# Patient Record
Sex: Female | Born: 1965
Health system: Southern US, Community
[De-identification: ages and names within clinical notes are randomized; demographics above are authoritative.]

## PROBLEM LIST (undated history)

## (undated) DIAGNOSIS — R011 Cardiac murmur, unspecified: Secondary | ICD-10-CM

## (undated) DIAGNOSIS — D6851 Activated protein C resistance: Secondary | ICD-10-CM

## (undated) DIAGNOSIS — M1990 Unspecified osteoarthritis, unspecified site: Secondary | ICD-10-CM

## (undated) HISTORY — DX: Cardiac murmur, unspecified: R01.1

## (undated) HISTORY — DX: Activated protein C resistance: D68.51

## (undated) HISTORY — DX: Unspecified osteoarthritis, unspecified site: M19.90

---

## 2003-03-20 ENCOUNTER — Other Ambulatory Visit: Admission: RE | Admit: 2003-03-20 | Discharge: 2003-03-20 | Payer: Self-pay | Admitting: Gynecology

## 2004-03-20 ENCOUNTER — Other Ambulatory Visit: Admission: RE | Admit: 2004-03-20 | Discharge: 2004-03-20 | Payer: Self-pay | Admitting: Gynecology

## 2004-12-15 HISTORY — PX: OTHER SURGICAL HISTORY: SHX169

## 2004-12-19 ENCOUNTER — Encounter: Admission: RE | Admit: 2004-12-19 | Discharge: 2004-12-19 | Payer: Self-pay | Admitting: Otolaryngology

## 2004-12-31 ENCOUNTER — Encounter (INDEPENDENT_AMBULATORY_CARE_PROVIDER_SITE_OTHER): Payer: Self-pay | Admitting: *Deleted

## 2004-12-31 ENCOUNTER — Ambulatory Visit (HOSPITAL_COMMUNITY): Admission: RE | Admit: 2004-12-31 | Discharge: 2004-12-31 | Payer: Self-pay | Admitting: Otolaryngology

## 2004-12-31 ENCOUNTER — Ambulatory Visit (HOSPITAL_BASED_OUTPATIENT_CLINIC_OR_DEPARTMENT_OTHER): Admission: RE | Admit: 2004-12-31 | Discharge: 2004-12-31 | Payer: Self-pay | Admitting: Otolaryngology

## 2005-03-24 ENCOUNTER — Other Ambulatory Visit: Admission: RE | Admit: 2005-03-24 | Discharge: 2005-03-24 | Payer: Self-pay | Admitting: Gynecology

## 2005-04-14 HISTORY — PX: HYSTEROSCOPY: SHX211

## 2005-05-01 ENCOUNTER — Ambulatory Visit (HOSPITAL_COMMUNITY): Admission: RE | Admit: 2005-05-01 | Discharge: 2005-05-01 | Payer: Self-pay | Admitting: Gynecology

## 2005-05-01 ENCOUNTER — Ambulatory Visit (HOSPITAL_BASED_OUTPATIENT_CLINIC_OR_DEPARTMENT_OTHER): Admission: RE | Admit: 2005-05-01 | Discharge: 2005-05-01 | Payer: Self-pay | Admitting: Gynecology

## 2005-05-01 ENCOUNTER — Encounter (INDEPENDENT_AMBULATORY_CARE_PROVIDER_SITE_OTHER): Payer: Self-pay | Admitting: Specialist

## 2005-09-26 ENCOUNTER — Ambulatory Visit: Payer: Self-pay | Admitting: Family Medicine

## 2006-03-30 ENCOUNTER — Other Ambulatory Visit: Admission: RE | Admit: 2006-03-30 | Discharge: 2006-03-30 | Payer: Self-pay | Admitting: Gynecology

## 2007-04-05 ENCOUNTER — Other Ambulatory Visit: Admission: RE | Admit: 2007-04-05 | Discharge: 2007-04-05 | Payer: Self-pay | Admitting: Gynecology

## 2008-04-06 ENCOUNTER — Other Ambulatory Visit: Admission: RE | Admit: 2008-04-06 | Discharge: 2008-04-06 | Payer: Self-pay | Admitting: Gynecology

## 2008-10-13 ENCOUNTER — Ambulatory Visit: Payer: Self-pay | Admitting: Gynecology

## 2009-04-09 ENCOUNTER — Ambulatory Visit: Payer: Self-pay | Admitting: Gynecology

## 2009-04-09 ENCOUNTER — Other Ambulatory Visit: Admission: RE | Admit: 2009-04-09 | Discharge: 2009-04-09 | Payer: Self-pay | Admitting: Gynecology

## 2009-04-09 ENCOUNTER — Encounter: Payer: Self-pay | Admitting: Gynecology

## 2009-04-17 ENCOUNTER — Ambulatory Visit: Payer: Self-pay | Admitting: Gynecology

## 2009-04-17 ENCOUNTER — Encounter: Admission: RE | Admit: 2009-04-17 | Discharge: 2009-04-17 | Payer: Self-pay | Admitting: Gynecology

## 2009-04-27 ENCOUNTER — Ambulatory Visit: Payer: Self-pay | Admitting: Gynecology

## 2009-06-01 ENCOUNTER — Ambulatory Visit: Payer: Self-pay | Admitting: Gynecology

## 2009-06-15 ENCOUNTER — Ambulatory Visit: Payer: Self-pay | Admitting: Gynecology

## 2009-07-05 ENCOUNTER — Encounter (INDEPENDENT_AMBULATORY_CARE_PROVIDER_SITE_OTHER): Payer: Self-pay | Admitting: *Deleted

## 2009-07-27 ENCOUNTER — Ambulatory Visit: Payer: Self-pay | Admitting: Gynecology

## 2009-08-15 HISTORY — PX: IUD REMOVAL: SHX5392

## 2009-08-17 ENCOUNTER — Ambulatory Visit: Payer: Self-pay | Admitting: Gynecology

## 2010-04-15 ENCOUNTER — Other Ambulatory Visit: Admission: RE | Admit: 2010-04-15 | Discharge: 2010-04-15 | Payer: Self-pay | Admitting: Gynecology

## 2010-04-15 ENCOUNTER — Ambulatory Visit: Payer: Self-pay | Admitting: Gynecology

## 2010-06-27 ENCOUNTER — Ambulatory Visit: Payer: Self-pay | Admitting: Gynecology

## 2011-05-02 NOTE — Op Note (Signed)
[name], [name]                 ACCOUNT NO.:  [id number]   MEDICAL RECORD NO.:  [id number]          PATIENT TYPE:  AMB   LOCATION:  [facility]                          FACILITY:  [facility]   PHYSICIAN:  Christopher E. Ezzard Standing, M.D.DATE OF BIRTH:  [birth year]   DATE OF PROCEDURE:  12/31/2004  DATE OF DISCHARGE:                                 OPERATIVE REPORT   PREOPERATIVE DIAGNOSES:  1.  Chronic right sphenoid sinus disease with changes consistent with fungal      sinusitis.  2.  Right ethmoid sinus disease.  3.  Chronic cryptic tonsillitis.   POSTOPERATIVE DIAGNOSES:  1.  Chronic right sphenoid sinus disease with changes consistent with fungal      sinusitis.  2.  Right ethmoid sinus disease.  3.  Chronic cryptic tonsillitis.   OPERATION:  1.  Frontal endoscopic sinus surgery with a right ethmoidectomy and right      sphenoidotomy with removal of fungal disease.  2.  Tonsillectomy.   SURGEON:  Kristine Garbe. Ezzard Standing, M.D.   ANESTHESIA:  General endotracheal.   COMPLICATIONS:  None.   BRIEF CLINICAL NOTE:  Colleen Gardner is a 45 year old female who has had  essentially a chronic sinus disease for several years.  She has had a  previous evaluation in 2002 which showed chronic, predominantly sphenoid  right-sided sinus disease.  She has had persistent drainage posteriorly from  the right sphenoid sinus for a number of years and on repeat CT scan  recently, she had what appeared to be a calcification and opacification of  the right sphenoid sinus consistent with a chronic right fungal sphenoid  sinusitis.  She has also had intermittent sore throats with moderate-sized  cryptic tonsils with white debris building up in the tonsillar crypts.  She  is taken to the operating room at this time for endoscopic sinus surgery  with a right ethmoidectomy, sphenoidotomy with removal of fungal sinus  disease and tonsillectomy.   DESCRIPTION OF PROCEDURE:  After adequate endotracheal anesthesia,  the  patient received 10 mg of Decadron IV preoperatively as well as 1 g of Ancef  IV preoperatively.  Nose was prepped with cotton pledgets soaked in Afrin  decongestant.  The turbinate and ethmoid area was then injected with  Xylocaine with epinephrine for hemostasis.  The lower portion of the middle  turbinate on the right side was amputated, which led back to the posterior  ethmoid area.  The posterior ethmoid area was opened up, showed only minimal  mucosal thickening, but no fungal elements in the ethmoid area.  The  patient's sphenoid sinus was again opened and enlarged with Kerrison forceps  and throughcut forceps.  On entering the sphenoid sinus, there was a ball of  hardened fungal-type tissue.  This was broken down and removed.  Specimen  was sent to pathology.  After removing all the visible fungal disease from  the sphenoid sinus, the sinus was irrigated with saline.  Hemostasis around  the sphenoidotomy was obtained with suction cautery.  This completed the  right-side ethmoidectomy and sphenoidotomy with removal of fungal disease.  The  patient was then turned and a mouth gag was used to expose the  oropharynx.  Tonsils were resected from the tonsillar fossae using cautery.  Care was taken to preserve the anterior and posterior tonsillar pillars as  well as the uvula.  Hemostasis was obtained with the cautery.  Oropharynx  was irrigated with saline.  This completed the procedure.  Colleen Gardner was awoken  from anesthesia and transferred to recovery postop doing well.   DISPOSITION:  Colleen Gardner is discharged home later this morning on Keflex  suspension 500 mg b.i.d. for 1 week and Tylenol and Lortab p.r.n. pain  I  will have her follow up in my office in 10 days for recheck.       CEN/MEDQ  D:  12/31/2004  T:  12/31/2004  Job:  [id number]

## 2011-05-02 NOTE — Op Note (Signed)
NAME:  Colleen Gardner, Colleen Gardner                 ACCOUNT NO.:  [id number]   MEDICAL RECORD NO.:  [id number]          PATIENT TYPE:  AMB   LOCATION:  [facility]                         FACILITY:  [facility]   PHYSICIAN:  Timothy P. Fontaine, M.D.DATE OF BIRTH:  10/10/1966   DATE OF PROCEDURE:  05/01/2005  DATE OF DISCHARGE:                                 OPERATIVE REPORT   .   PREOPERATIVE DIAGNOSES:  Submucous myomata.   POSTOPERATIVE DIAGNOSES:  Submucous myomata.   PROCEDURE:  Hysteroscopic submucous myomectomy.   SURGEON:  Timothy P. Fontaine, M.D.   ANESTHETIC:  General.   ESTIMATED BLOOD LOSS:  Minimal.   COMPLICATIONS:  None.   SORBITOL DISCREPANCY:  70 cc.   SPECIMEN:  1.  EMC.  2.  Myoma.   FINDINGS:  Mild arcuate cavity, submucous myoma, anterior lower uterine  segment, right and left tubal ostia visualized, remainder of cavity normal,  lower uterine segment and the cervical canal all normal.   DESCRIPTION OF PROCEDURE:  The patient was taken to the operating room,  underwent general anesthesia, was placed in the low dorsal lithotomy  position, received a vaginal and perineal preparation with Betadine  solution. Bladder emptied with in-and-out Foley catheterization. EUA  performed. The patient was draped in usual fashion. The cervix was  visualized with a weighted speculum, anterior lip grasped with single-tooth  tenaculum, and the cervix was gently gradually dilated to admit the  operative hysteroscope. Hysteroscopy was performed with findings noted  above. Using the right-angle resectoscopic loop, the submucous myoma was  resected in its entirety. The fragments were removed. A sharp curettage  performed.  Re-hysteroscopy showed an empty normal-appearing mild  arcuate cavity, good distention, no evidence of perforation. The instruments  were removed, adequate hemostasis visualized, the patient placed in the  supine position, awakened without difficulty, and taken to the recovery  room  in good condition, having tolerated procedure well,      TPF/MEDQ  D:  05/01/2005  T:  05/01/2005  Job:  [id number]

## 2011-05-02 NOTE — H&P (Signed)
NAME:  Colleen Gardner, Colleen Gardner                 ACCOUNT NO.:  [id number]   MEDICAL RECORD NO.:  [id number]          PATIENT TYPE:  AMB   LOCATION:  [facility]                         FACILITY:  [facility]   PHYSICIAN:  Timothy P. Fontaine, M.D.DATE OF BIRTH:  [birth year]   DATE OF ADMISSION:  05/01/2005  DATE OF DISCHARGE:                                HISTORY & PHYSICAL   ADMISSION DATE:  May 01, 2005 at 8:30.   CHIEF COMPLAINT:  Pelvic discomfort.   HISTORY OF PRESENT ILLNESS:  A 45 year old G56, P2 female on Ortho-Novum  7/7/7 oral contraceptives, presents complaining of some pelvic discomfort.  The patient underwent ultrasound, which was overall normal with right and  left ovaries visualized. No cul-de-sac fluid, although on endometrial  evaluation, she has what appears to be a well circumscribed submucosal myoma  in the upper mid cavity. The patient is admitted at this time for a  hysteroscopic evaluation and possible resection.   PAST MEDICAL HISTORY:  Uncomplicated.   PAST SURGICAL HISTORY:  Tonsillectomy and sinus surgery.   ALLERGIES:  None.   MEDICATIONS:  Ortho-Novum.   REVIEW OF SYSTEMS:  Noncontributory.   FAMILY HISTORY:  Noncontributory.   SOCIAL HISTORY:  Noncontributory.   PHYSICAL EXAMINATION:  VITAL SIGNS:  Afebrile. Vital signs are stable.  HEENT:  Normal.  LUNGS:  Clear.  CARDIAC:  Regular rate without murmur, rub, or gallop.  ABDOMEN:  Examination benign.  PELVIC:  External BUN vagina normal. Cervix normal. Uterus normal size.  Midline mobile. Adnexa without masses or tenderness.   ASSESSMENT:  A 45 year old G2, P2 female with some pelvic discomfort with  ultrasound suggestive of an endometrial defect, possible myoma versus polyp.   PLAN:  The patient is admitted at this time for hysteroscopy and resection  of defect. I reviewed with the patient the findings and the options for  sonohysterogram for better definition versus proceeding directly with  hysteroscopy was  reviewed. The area is well defined and certainly is very  suggestive of a polyp or myoma and we are going to go ahead and proceed with  hysteroscopy. Of note, she does have some pelvic discomfort for which she  has seen a urologist and they do not feel cystoscopy or other surgical  intervention is necessary and we are going to proceed with a hysteroscopy at  this time. I reviewed with her what is involved with hysteroscopy, resection  to include the expected intraoperative postoperative courses. I reviewed the  use of the resectoscope, use of the hysteroscopy, and the procedure. The  risks of bleeding, transfusion, infection, damage to internal organs with  uterine perforation including bowel, bladder, ureters, vessels, and nerves,  necessitating major exploratory reparative surgeries and future reparative  surgeries including ostomy formation was all discussed, understood and  accepted. The patient's questions are answered to her satisfaction and she  is ready to proceed with surgery.      TPF/MEDQ  D:  04/28/2005  T:  04/28/2005  Job:  [id number]

## 2011-06-23 ENCOUNTER — Encounter: Payer: Self-pay | Admitting: Gynecology

## 2011-06-30 ENCOUNTER — Encounter (INDEPENDENT_AMBULATORY_CARE_PROVIDER_SITE_OTHER): Payer: 59 | Admitting: Gynecology

## 2011-06-30 ENCOUNTER — Other Ambulatory Visit: Payer: Self-pay | Admitting: Gynecology

## 2011-06-30 ENCOUNTER — Other Ambulatory Visit (HOSPITAL_COMMUNITY)
Admission: RE | Admit: 2011-06-30 | Discharge: 2011-06-30 | Disposition: A | Discharge status: Home / Self Care | Payer: 59 | Source: Ambulatory Visit | Attending: Gynecology | Admitting: Gynecology

## 2011-06-30 DIAGNOSIS — Z833 Family history of diabetes mellitus: Secondary | ICD-10-CM

## 2011-06-30 DIAGNOSIS — Z124 Encounter for screening for malignant neoplasm of cervix: Secondary | ICD-10-CM | POA: Insufficient documentation

## 2011-06-30 DIAGNOSIS — Z13220 Encounter for screening for lipoid disorders: Secondary | ICD-10-CM

## 2011-06-30 DIAGNOSIS — Z01419 Encounter for gynecological examination (general) (routine) without abnormal findings: Secondary | ICD-10-CM

## 2011-06-30 DIAGNOSIS — B373 Candidiasis of vulva and vagina: Secondary | ICD-10-CM

## 2011-10-31 ENCOUNTER — Other Ambulatory Visit: Payer: Self-pay | Admitting: Gynecology

## 2012-04-28 ENCOUNTER — Telehealth: Payer: Self-pay | Admitting: *Deleted

## 2012-04-28 MED ORDER — FLUCONAZOLE 150 MG PO TABS: 150.0000 mg | ORAL_TABLET | Freq: Once | ORAL | Status: AC

## 2012-04-28 NOTE — Telephone Encounter (Signed)
Pt calling c/o yeast infection x 3 days now, itching, white discharge, burning. OV offered pt said she cant come until Friday. Pt would like rx. Last OV 06/2011 Please advise

## 2012-04-28 NOTE — Telephone Encounter (Signed)
Diflucan 150 mg x1 dose office visit if symptoms persist

## 2012-04-28 NOTE — Telephone Encounter (Signed)
PT INFORMED WITH THE BELOW NOTE. 

## 2012-04-30 ENCOUNTER — Encounter: Payer: Self-pay | Admitting: Gynecology

## 2012-04-30 ENCOUNTER — Ambulatory Visit (INDEPENDENT_AMBULATORY_CARE_PROVIDER_SITE_OTHER): Payer: 59 | Admitting: Gynecology

## 2012-04-30 DIAGNOSIS — D6851 Activated protein C resistance: Secondary | ICD-10-CM | POA: Insufficient documentation

## 2012-04-30 DIAGNOSIS — L293 Anogenital pruritus, unspecified: Secondary | ICD-10-CM

## 2012-04-30 DIAGNOSIS — I341 Nonrheumatic mitral (valve) prolapse: Secondary | ICD-10-CM | POA: Insufficient documentation

## 2012-04-30 DIAGNOSIS — N760 Acute vaginitis: Secondary | ICD-10-CM

## 2012-04-30 DIAGNOSIS — A499 Bacterial infection, unspecified: Secondary | ICD-10-CM

## 2012-04-30 DIAGNOSIS — N898 Other specified noninflammatory disorders of vagina: Secondary | ICD-10-CM

## 2012-04-30 LAB — WET PREP FOR TRICH, YEAST, CLUE
Clue Cells Wet Prep HPF POC: NONE SEEN
Trich, Wet Prep: NONE SEEN
Yeast Wet Prep HPF POC: NONE SEEN

## 2012-04-30 MED ORDER — METRONIDAZOLE 500 MG PO TABS: 500.0000 mg | ORAL_TABLET | Freq: Two times a day (BID) | ORAL | Status: AC

## 2012-04-30 NOTE — Progress Notes (Signed)
Patient presents with approximately one-week history of vaginal discharge and itching. She recalled we prescribe Diflucan 150x1 dose. This did not seem to help. She used OTC Monistat and her symptoms persisted.  Exam with Selena Batten chaperone present. External BUS vagina with white discharge. Cervix normal. Uterus normal size midline mobile nontender. Adnexa without masses or tenderness.  Assessment and plan: Wet prep consistent with BV. We'll treat with Flagyl 500 twice a day x7 days, alcohol avoidance reviewed. Follow up if symptoms persist or recur. Patient is due for her annual in July and I reminded her to schedule this.

## 2012-04-30 NOTE — Patient Instructions (Signed)
Take Flagyl 500 mg twice daily for 7 days. Avoid alcohol. Follow up if symptoms persist or recur. Make appointment for your annual exam in July.

## 2012-05-11 ENCOUNTER — Telehealth: Payer: Self-pay | Admitting: *Deleted

## 2012-05-11 MED ORDER — CLINDAMYCIN PHOSPHATE 2 % VA CREA: 1.0000 | TOPICAL_CREAM | Freq: Every day | VAGINAL | Status: AC

## 2012-05-11 NOTE — Telephone Encounter (Signed)
Pt was giving Flagyl 500 twice a day x7 days for BV infection on 04/30/12, she took all medication and now c/o foul odor after sex. Pt is wondering if BV infection is still there? Pt said she feels fine, no other complaints only odor after sex. Please advise

## 2012-05-11 NOTE — Telephone Encounter (Signed)
I would recommend Cleocin vaginal cream nightly x1 week. Office visit if symptoms persist.

## 2012-05-12 NOTE — Telephone Encounter (Signed)
Left on voicemail rx sent to pharmacy.

## 2012-07-02 ENCOUNTER — Encounter: Payer: Self-pay | Admitting: Gynecology

## 2012-07-02 ENCOUNTER — Ambulatory Visit (INDEPENDENT_AMBULATORY_CARE_PROVIDER_SITE_OTHER): Payer: 59 | Admitting: Gynecology

## 2012-07-02 VITALS — BP 110/64 | Ht 65.0 in | Wt 114.0 lb

## 2012-07-02 DIAGNOSIS — N951 Menopausal and female climacteric states: Secondary | ICD-10-CM

## 2012-07-02 DIAGNOSIS — Z01419 Encounter for gynecological examination (general) (routine) without abnormal findings: Secondary | ICD-10-CM

## 2012-07-02 NOTE — Patient Instructions (Signed)
Office will call you with hormone results. Keep menstrual calendar as long as regular menses we will follow. If irregular menses and call. Follow up in one year for annual gynecologic exam.

## 2012-07-02 NOTE — Progress Notes (Signed)
Colleen Gardner [birth year] [id number]        46 y.o.  G2P2002 for annual exam.  Several issues noted below.  Past medical history,surgical history, medications, allergies, family history and social history were all reviewed and documented in the EPIC chart. ROS:  Was performed and pertinent positives and negatives are included in the history.  Exam: Kim assistant Filed Vitals:   07/02/12 1516  BP: 110/64  Height: 5\' 5"  (1.651 m)  Weight: 114 lb (51.71 kg)   General appearance  Normal Skin grossly normal Head/Neck normal with no cervical or supraclavicular adenopathy thyroid normal Lungs  clear Cardiac RR, without RMG Abdominal  soft, nontender, without masses, organomegaly or hernia Breasts  examined lying and sitting without masses, retractions, discharge or axillary adenopathy. Pelvic  Ext/BUS/vagina  normal   Cervix  normal   Uterus  anteverted, normal size, shape and contour, midline and mobile nontender   Adnexa  Without masses or tenderness    Anus and perineum  normal   Rectovaginal  normal sphincter tone without palpated masses or tenderness.    Assessment/Plan:  46 y.o. G14P2002 female for annual exam, vasectomy birth control.   1. Missed menses in February otherwise regular monthly menses. Some hot flushes night sweats coming and going but not consistently. We'll check baseline FSH TSH. I think this is an auditory irregularity and patient will keep menstrual calendar as long as menses remain normal then we'll follow. 2. Pap smear. Last Pap smear 2012. No Pap done today. No history of abnormal Pap smears with numerous normal reports in her chart. I discussed current screening guidelines we'll plan every 3-5 your Pap smears. 3. Mammography.  Patient had her mammogram this year. She'll continue with annual mammography. SBE monthly reviewed. 4. Health maintenance. Patient had normal CBC lipid profile and glucose last year and will not repeat now. Assuming regular menses and  otherwise doing well and she will see [place] in a year, sooner as needed.    Dara Lords MD, 4:00 PM 07/02/2012

## 2012-07-03 LAB — URINALYSIS W MICROSCOPIC + REFLEX CULTURE
Bacteria, UA: NONE SEEN
Bilirubin Urine: NEGATIVE
Ketones, ur: NEGATIVE mg/dL
Protein, ur: NEGATIVE mg/dL
Urobilinogen, UA: 1 mg/dL (ref 0.0–1.0)

## 2012-07-03 LAB — TSH: TSH: 2.223 u[IU]/mL (ref 0.350–4.500)

## 2012-08-09 ENCOUNTER — Telehealth: Payer: Self-pay | Admitting: *Deleted

## 2012-08-09 NOTE — Telephone Encounter (Signed)
Pt informed with the below note. She will follow up as needed. 

## 2012-08-09 NOTE — Telephone Encounter (Signed)
Watch for now. As long as it is resolved we will follow. If it becomes recurrent call and we'll schedule ultrasound.

## 2012-08-09 NOTE — Telephone Encounter (Signed)
Pt had left side lower pain on Friday afternoon, sharp pain radiating down to her groin area. Pt said pain lasted for about 4 hours off & on. Pt said this has happened before 2 weeks after her cycle. LMP:07/22/12. She is not in any pain now. Pt would like advice if ultrasound should be done? Please advise

## 2013-07-13 ENCOUNTER — Ambulatory Visit: Payer: 59 | Admitting: Sports Medicine

## 2013-09-02 ENCOUNTER — Encounter: Payer: Self-pay | Admitting: Gynecology

## 2013-09-02 ENCOUNTER — Ambulatory Visit (INDEPENDENT_AMBULATORY_CARE_PROVIDER_SITE_OTHER): Payer: 59 | Admitting: Gynecology

## 2013-09-02 VITALS — BP 110/70 | Ht 65.0 in | Wt 119.0 lb

## 2013-09-02 DIAGNOSIS — N814 Uterovaginal prolapse, unspecified: Secondary | ICD-10-CM

## 2013-09-02 DIAGNOSIS — N898 Other specified noninflammatory disorders of vagina: Secondary | ICD-10-CM

## 2013-09-02 DIAGNOSIS — N899 Noninflammatory disorder of vagina, unspecified: Secondary | ICD-10-CM

## 2013-09-02 DIAGNOSIS — Z01419 Encounter for gynecological examination (general) (routine) without abnormal findings: Secondary | ICD-10-CM

## 2013-09-02 DIAGNOSIS — Z13220 Encounter for screening for lipoid disorders: Secondary | ICD-10-CM

## 2013-09-02 DIAGNOSIS — N816 Rectocele: Secondary | ICD-10-CM

## 2013-09-02 LAB — COMPREHENSIVE METABOLIC PANEL
Alkaline Phosphatase: 41 U/L (ref 39–117)
CO2: 27 mEq/L (ref 19–32)
Creat: 0.72 mg/dL (ref 0.50–1.10)
Glucose, Bld: 89 mg/dL (ref 70–99)
Sodium: 136 mEq/L (ref 135–145)
Total Bilirubin: 0.5 mg/dL (ref 0.3–1.2)
Total Protein: 6.8 g/dL (ref 6.0–8.3)

## 2013-09-02 LAB — CBC WITH DIFFERENTIAL/PLATELET
Basophils Relative: 1 % (ref 0–1)
Eosinophils Absolute: 0.1 10*3/uL (ref 0.0–0.7)
Eosinophils Relative: 2 % (ref 0–5)
Hemoglobin: 14.5 g/dL (ref 12.0–15.0)
MCH: 33.9 pg (ref 26.0–34.0)
MCHC: 35.1 g/dL (ref 30.0–36.0)
MCV: 96.5 fL (ref 78.0–100.0)
Monocytes Relative: 6 % (ref 3–12)
Neutrophils Relative %: 63 % (ref 43–77)
Platelets: 200 10*3/uL (ref 150–400)

## 2013-09-02 LAB — LIPID PANEL
Cholesterol: 143 mg/dL (ref 0–200)
HDL: 61 mg/dL (ref >39)
LDL Cholesterol: 62 mg/dL (ref 0–99)
Total CHOL/HDL Ratio: 2.3 Ratio
Triglycerides: 99 mg/dL (ref <150)
VLDL: 20 mg/dL (ref 0–40)

## 2013-09-02 LAB — WET PREP FOR TRICH, YEAST, CLUE
Clue Cells Wet Prep HPF POC: NONE SEEN
Trich, Wet Prep: NONE SEEN

## 2013-09-02 MED ORDER — METRONIDAZOLE 500 MG PO TABS: 500.0000 mg | ORAL_TABLET | Freq: Two times a day (BID) | ORAL | Status: DC

## 2013-09-02 NOTE — Progress Notes (Signed)
Colleen Gardner 06-15-1966 [id number]        47 y.o.  G2P2002 for annual exam.  Several issues noted below.  Past medical history,surgical history, medications, allergies, family history and social history were all reviewed and documented in the EPIC chart.  ROS:  Performed and pertinent positives and negatives are included in the history, assessment and plan .  Exam: Kim assistant Filed Vitals:   09/02/13 1429  BP: 110/70  Height: 5\' 5"  (1.651 m)  Weight: 119 lb (53.978 kg)   General appearance  Normal Skin grossly normal Head/Neck normal with no cervical or supraclavicular adenopathy thyroid normal Lungs  clear Cardiac RR, without RMG Abdominal  soft, nontender, without masses, organomegaly or hernia Breasts  examined lying and sitting without masses, retractions, discharge or axillary adenopathy. Pelvic  Ext/BUS/vagina  with second-degree rectocele with straining. Cervix within 2 fingerbreadths of introital opening with straining.   Cervix  normal   Uterus  anteverted, normal size, shape and contour, midline and mobile nontender   Adnexa  Without masses or tenderness    Anus and perineum  normal   Rectovaginal  mild decreased sphincter tone without palpated masses or tenderness. Second-degree rectocele noted   Assessment/Plan:  47 y.o. [id number] female for annual exam, regular menses, vasectomy birth control.   1. Pelvic relaxation. Patient with symptoms of something coming out particularly with exercise. Exam consistent with uterine prolapse and rectocele. Bladder overall looks well supported. Is not having any issues with urinary or fecal incontinence. Options for management include observation, trial of pessary and surgery to include TVH posterior colporrhaphy discussed. Patient is not interested in pursuing at this point but prefers to observe. Will followup if it becomes more of an issue. 2. Complaints of vaginal irritation. Used OTC antifungal suppositories last week with some  improvement. Wet prep today suggestive of bacterial vaginosis. Will treat with Flagyl 500 mg twice a day x7 days. Alcohol avoidance reviewed. 3. Pap smear 2012. No Pap smear done today. No history of abnormal Pap smears previously. Plan repeat Pap smear next year 3 year interval. 4. Mammography this year. Will continue with annual mammography. SBE monthly reviewed. 5. Health maintenance. Baseline CBC comprehensive metabolic panel lipid profile urinalysis ordered. Followup in one year, sooner if pelvic relaxation becomes an issue.  Note: This document was prepared with digital dictation and possible smart phrase technology. Any transcriptional errors that result from this process are unintentional.   Dara Lords MD, 3:14 PM 09/02/2013

## 2013-09-02 NOTE — Patient Instructions (Signed)
Followup in one year for annual exam, sooner if any issues 

## 2013-09-03 LAB — URINALYSIS W MICROSCOPIC + REFLEX CULTURE
Bacteria, UA: NONE SEEN
Bilirubin Urine: NEGATIVE
Crystals: NONE SEEN
Hgb urine dipstick: NEGATIVE
Ketones, ur: NEGATIVE mg/dL
Nitrite: NEGATIVE
Protein, ur: NEGATIVE mg/dL
Urobilinogen, UA: 0.2 mg/dL (ref 0.0–1.0)

## 2013-09-05 ENCOUNTER — Telehealth: Payer: Self-pay | Admitting: *Deleted

## 2013-09-05 MED ORDER — CLINDAMYCIN HCL 300 MG PO CAPS: 300.0000 mg | ORAL_CAPSULE | Freq: Two times a day (BID) | ORAL | Status: DC

## 2013-09-05 NOTE — Telephone Encounter (Signed)
Pt was given flagyl on OV 09/02/13 she took 3 pills and had to stop because she started to experience some pain behind her eyes, headache. Pt asked if another Rx could be given? Her cycle will be starting soon just as FYI if would be prescribed. Please advise

## 2013-09-05 NOTE — Telephone Encounter (Signed)
Cleocin 300 mg twice a day x7 days

## 2013-09-05 NOTE — Telephone Encounter (Signed)
Left on voicemail,rx sent. 

## 2013-10-20 ENCOUNTER — Other Ambulatory Visit: Payer: Self-pay

## 2014-01-27 ENCOUNTER — Encounter: Payer: Self-pay | Admitting: Gynecology

## 2014-01-27 ENCOUNTER — Telehealth: Payer: Self-pay | Admitting: *Deleted

## 2014-01-27 ENCOUNTER — Ambulatory Visit (INDEPENDENT_AMBULATORY_CARE_PROVIDER_SITE_OTHER): Payer: 59 | Admitting: Gynecology

## 2014-01-27 DIAGNOSIS — N6019 Diffuse cystic mastopathy of unspecified breast: Secondary | ICD-10-CM

## 2014-01-27 DIAGNOSIS — B9689 Other specified bacterial agents as the cause of diseases classified elsewhere: Secondary | ICD-10-CM

## 2014-01-27 DIAGNOSIS — N760 Acute vaginitis: Secondary | ICD-10-CM

## 2014-01-27 DIAGNOSIS — A499 Bacterial infection, unspecified: Secondary | ICD-10-CM

## 2014-01-27 LAB — WET PREP FOR TRICH, YEAST, CLUE
Clue Cells Wet Prep HPF POC: NONE SEEN
TRICH WET PREP: NONE SEEN
WBC, Wet Prep HPF POC: NONE SEEN
Yeast Wet Prep HPF POC: NONE SEEN

## 2014-01-27 MED ORDER — CLINDAMYCIN PHOSPHATE 2 % VA CREA: 1.0000 | TOPICAL_CREAM | Freq: Every day | VAGINAL | Status: DC

## 2014-01-27 NOTE — Patient Instructions (Signed)
Use the Cleocin vaginal cream nightly for 7 days. Call if any issues with this. Office will contact you to arrange the breast ultrasound and mammogram. 6

## 2014-01-27 NOTE — Telephone Encounter (Signed)
Per note for 01/27/14 "New onset nodularity left breast. It feels to probably be fibrocystic changes. Will pursue ultrasound and diagnostic mammography for assurance" per pt request she would like done at [facility] order faxed appt on 02/01/14 @ 11:00 am left on voicemail this has been done

## 2014-01-27 NOTE — Progress Notes (Signed)
Colleen Gardner 12-10-1966 [id number]        48 y.o.  G3O7564 Patient presents with 2 separate issues:  1. New left breast mass noted over the last several weeks. Nontender. No nipple discharge or other issues. Mammogram reported this past summer. 2. One day history of vaginal odor. No real discharge or irritation. Past history of bacterial vaginosis.   Past medical history,surgical history, problem list, medications, allergies, family history and social history were all reviewed and documented in the EPIC chart.  Exam: Kim assistant General appearance  Normal  Breasts examined lying and sitting Right without masses,,, retractions discharge, adenopathy. Left with fibroglandular nodularity 12 to 1:00 position 2 fingerbreadths off the areola. Skin changes, nipple discharge or axillary adenopathy. Pelvic external BUS vagina with generalized erythema. No significant discharge. Cervix normal. Uterus normal size midline mobile nontender. Adnexa without masses or tenderness.  Assessment/Plan:  48 y.o. G2P2002   1. New onset nodularity left breast. It feels to probably be fibrocystic changes. Will pursue ultrasound and diagnostic mammography for assurance. Patient agrees with this and will followup for the studies as arranged. 2. History/exam/wet prep consistent with early bacterial vaginosis. She had reaction to both oral Flagyl and oral Cleocin both atypical with headaches diarrhea but no classic rash or hives. After lengthy discussion she wants a trial of Cleocin vaginal cream but acknowledges potential for allergic reaction to this also. Followup if any issues with the treatment or persistence/recurrence of her symptoms.    Note: This document was prepared with digital dictation and possible smart phrase technology. Any transcriptional errors that result from this process are unintentional.   Anastasio Auerbach MD, 10:45 AM 01/27/2014

## 2014-01-27 NOTE — Addendum Note (Signed)
Addended by: Nelva Nay on: 01/27/2014 11:16 AM   Modules accepted: Orders

## 2014-10-16 ENCOUNTER — Encounter: Payer: Self-pay | Admitting: Gynecology

## 2015-01-05 ENCOUNTER — Encounter: Payer: 59 | Admitting: Gynecology

## 2015-01-26 ENCOUNTER — Encounter: Payer: Self-pay | Admitting: Gynecology

## 2015-02-09 ENCOUNTER — Encounter: Payer: Self-pay | Admitting: Gynecology

## 2015-02-09 ENCOUNTER — Ambulatory Visit (INDEPENDENT_AMBULATORY_CARE_PROVIDER_SITE_OTHER): Payer: 59 | Admitting: Gynecology

## 2015-02-09 ENCOUNTER — Other Ambulatory Visit (HOSPITAL_COMMUNITY)
Admission: RE | Admit: 2015-02-09 | Discharge: 2015-02-09 | Disposition: A | Discharge status: Home / Self Care | Payer: 59 | Source: Ambulatory Visit | Attending: Gynecology | Admitting: Gynecology

## 2015-02-09 VITALS — BP 112/66 | Ht 66.0 in | Wt 118.0 lb

## 2015-02-09 DIAGNOSIS — Z01419 Encounter for gynecological examination (general) (routine) without abnormal findings: Secondary | ICD-10-CM | POA: Insufficient documentation

## 2015-02-09 DIAGNOSIS — N393 Stress incontinence (female) (male): Secondary | ICD-10-CM

## 2015-02-09 DIAGNOSIS — N814 Uterovaginal prolapse, unspecified: Secondary | ICD-10-CM

## 2015-02-09 DIAGNOSIS — N898 Other specified noninflammatory disorders of vagina: Secondary | ICD-10-CM

## 2015-02-09 DIAGNOSIS — Z1151 Encounter for screening for human papillomavirus (HPV): Secondary | ICD-10-CM | POA: Diagnosis present

## 2015-02-09 DIAGNOSIS — N816 Rectocele: Secondary | ICD-10-CM

## 2015-02-09 DIAGNOSIS — N951 Menopausal and female climacteric states: Secondary | ICD-10-CM

## 2015-02-09 LAB — CBC WITH DIFFERENTIAL/PLATELET
BASOS ABS: 0 10*3/uL (ref 0.0–0.1)
Basophils Relative: 1 % (ref 0–1)
EOS ABS: 0 10*3/uL (ref 0.0–0.7)
EOS PCT: 1 % (ref 0–5)
HCT: 43 % (ref 36.0–46.0)
HEMOGLOBIN: 14.4 g/dL (ref 12.0–15.0)
LYMPHS PCT: 31 % (ref 12–46)
Lymphs Abs: 1.5 10*3/uL (ref 0.7–4.0)
MCH: 32.7 pg (ref 26.0–34.0)
MCHC: 33.5 g/dL (ref 30.0–36.0)
MCV: 97.5 fL (ref 78.0–100.0)
MONOS PCT: 11 % (ref 3–12)
MPV: 11.2 fL (ref 8.6–12.4)
Monocytes Absolute: 0.5 10*3/uL (ref 0.1–1.0)
NEUTROS ABS: 2.7 10*3/uL (ref 1.7–7.7)
Neutrophils Relative %: 56 % (ref 43–77)
Platelets: 205 10*3/uL (ref 150–400)
RBC: 4.41 MIL/uL (ref 3.87–5.11)
RDW: 13.1 % (ref 11.5–15.5)
WBC: 4.8 10*3/uL (ref 4.0–10.5)

## 2015-02-09 LAB — WET PREP FOR TRICH, YEAST, CLUE
Clue Cells Wet Prep HPF POC: NONE SEEN
TRICH WET PREP: NONE SEEN
YEAST WET PREP: NONE SEEN

## 2015-02-09 LAB — TSH: TSH: 1.801 u[IU]/mL (ref 0.350–4.500)

## 2015-02-09 MED ORDER — FLUCONAZOLE 150 MG PO TABS: 150.0000 mg | ORAL_TABLET | Freq: Once | ORAL | Status: DC

## 2015-02-09 NOTE — Patient Instructions (Signed)
You may obtain a copy of any labs that were done today by logging onto MyChart as outlined in the instructions provided with your AVS (after visit summary). The office will not call with normal lab results but certainly if there are any significant abnormalities then we will contact you.   Health Maintenance, Female A healthy lifestyle and preventative care can promote health and wellness.  Maintain regular health, dental, and eye exams.  Eat a healthy diet. Foods like vegetables, fruits, whole grains, low-fat dairy products, and lean protein foods contain the nutrients you need without too many calories. Decrease your intake of foods high in solid fats, added sugars, and salt. Get information about a proper diet from your caregiver, if necessary.  Regular physical exercise is one of the most important things you can do for your health. Most adults should get at least 150 minutes of moderate-intensity exercise (any activity that increases your heart rate and causes you to sweat) each week. In addition, most adults need muscle-strengthening exercises on 2 or more days a week.   Maintain a healthy weight. The body mass index (BMI) is a screening tool to identify possible weight problems. It provides an estimate of body fat based on height and weight. Your caregiver can help determine your BMI, and can help you achieve or maintain a healthy weight. For adults 20 years and older:  A BMI below 18.5 is considered underweight.  A BMI of 18.5 to 24.9 is normal.  A BMI of 25 to 29.9 is considered overweight.  A BMI of 30 and above is considered obese.  Maintain normal blood lipids and cholesterol by exercising and minimizing your intake of saturated fat. Eat a balanced diet with plenty of fruits and vegetables. Blood tests for lipids and cholesterol should begin at age 61 and be repeated every 5 years. If your lipid or cholesterol levels are high, you are over 50, or you are a high risk for heart  disease, you may need your cholesterol levels checked more frequently.Ongoing high lipid and cholesterol levels should be treated with medicines if diet and exercise are not effective.  If you smoke, find out from your caregiver how to quit. If you do not use tobacco, do not start.  Lung cancer screening is recommended for adults aged 33 80 years who are at high risk for developing lung cancer because of a history of smoking. Yearly low-dose computed tomography (CT) is recommended for people who have at least a 30-pack-year history of smoking and are a current smoker or have quit within the past 15 years. A pack year of smoking is smoking an average of 1 pack of cigarettes a day for 1 year (for example: 1 pack a day for 30 years or 2 packs a day for 15 years). Yearly screening should continue until the smoker has stopped smoking for at least 15 years. Yearly screening should also be stopped for people who develop a health problem that would prevent them from having lung cancer treatment.  If you are pregnant, do not drink alcohol. If you are breastfeeding, be very cautious about drinking alcohol. If you are not pregnant and choose to drink alcohol, do not exceed 1 drink per day. One drink is considered to be 12 ounces (355 mL) of beer, 5 ounces (148 mL) of wine, or 1.5 ounces (44 mL) of liquor.  Avoid use of street drugs. Do not share needles with anyone. Ask for help if you need support or instructions about stopping  the use of drugs.  High blood pressure causes heart disease and increases the risk of stroke. Blood pressure should be checked at least every 1 to 2 years. Ongoing high blood pressure should be treated with medicines, if weight loss and exercise are not effective.  If you are 59 to 47 years old, ask your caregiver if you should take aspirin to prevent strokes.  Diabetes screening involves taking a blood sample to check your fasting blood sugar level. This should be done once every 3  years, after age 91, if you are within normal weight and without risk factors for diabetes. Testing should be considered at a younger age or be carried out more frequently if you are overweight and have at least 1 risk factor for diabetes.  Breast cancer screening is essential preventative care for women. You should practice "breast self-awareness." This means understanding the normal appearance and feel of your breasts and may include breast self-examination. Any changes detected, no matter how small, should be reported to a caregiver. Women in their 66s and 30s should have a clinical breast exam (CBE) by a caregiver as part of a regular health exam every 1 to 3 years. After age 101, women should have a CBE every year. Starting at age 100, women should consider having a mammogram (breast X-ray) every year. Women who have a family history of breast cancer should talk to their caregiver about genetic screening. Women at a high risk of breast cancer should talk to their caregiver about having an MRI and a mammogram every year.  Breast cancer gene (BRCA)-related cancer risk assessment is recommended for women who have family members with BRCA-related cancers. BRCA-related cancers include breast, ovarian, tubal, and peritoneal cancers. Having family members with these cancers may be associated with an increased risk for harmful changes (mutations) in the breast cancer genes BRCA1 and BRCA2. Results of the assessment will determine the need for genetic counseling and BRCA1 and BRCA2 testing.  The Pap test is a screening test for cervical cancer. Women should have a Pap test starting at age 57. Between ages 25 and 35, Pap tests should be repeated every 2 years. Beginning at age 37, you should have a Pap test every 3 years as long as the past 3 Pap tests have been normal. If you had a hysterectomy for a problem that was not cancer or a condition that could lead to cancer, then you no longer need Pap tests. If you are  between ages 50 and 76, and you have had normal Pap tests going back 10 years, you no longer need Pap tests. If you have had past treatment for cervical cancer or a condition that could lead to cancer, you need Pap tests and screening for cancer for at least 20 years after your treatment. If Pap tests have been discontinued, risk factors (such as a new sexual partner) need to be reassessed to determine if screening should be resumed. Some women have medical problems that increase the chance of getting cervical cancer. In these cases, your caregiver may recommend more frequent screening and Pap tests.  The human papillomavirus (HPV) test is an additional test that may be used for cervical cancer screening. The HPV test looks for the virus that can cause the cell changes on the cervix. The cells collected during the Pap test can be tested for HPV. The HPV test could be used to screen women aged 44 years and older, and should be used in women of any age  who have unclear Pap test results. After the age of 55, women should have HPV testing at the same frequency as a Pap test.  Colorectal cancer can be detected and often prevented. Most routine colorectal cancer screening begins at the age of 44 and continues through age 20. However, your caregiver may recommend screening at an earlier age if you have risk factors for colon cancer. On a yearly basis, your caregiver may provide home test kits to check for hidden blood in the stool. Use of a small camera at the end of a tube, to directly examine the colon (sigmoidoscopy or colonoscopy), can detect the earliest forms of colorectal cancer. Talk to your caregiver about this at age 86, when routine screening begins. Direct examination of the colon should be repeated every 5 to 10 years through age 13, unless early forms of pre-cancerous polyps or small growths are found.  Hepatitis C blood testing is recommended for all people born from [birth year] through 1963 and any  individual with known risks for hepatitis C.  Practice safe sex. Use condoms and avoid high-risk sexual practices to reduce the spread of sexually transmitted infections (STIs). Sexually active women aged 36 and younger should be checked for Chlamydia, which is a common sexually transmitted infection. Older women with new or multiple partners should also be tested for Chlamydia. Testing for other STIs is recommended if you are sexually active and at increased risk.  Osteoporosis is a disease in which the bones lose minerals and strength with aging. This can result in serious bone fractures. The risk of osteoporosis can be identified using a bone density scan. Women ages 20 and over and women at risk for fractures or osteoporosis should discuss screening with their caregivers. Ask your caregiver whether you should be taking a calcium supplement or vitamin D to reduce the rate of osteoporosis.  Menopause can be associated with physical symptoms and risks. Hormone replacement therapy is available to decrease symptoms and risks. You should talk to your caregiver about whether hormone replacement therapy is right for you.  Use sunscreen. Apply sunscreen liberally and repeatedly throughout the day. You should seek shade when your shadow is shorter than you. Protect yourself by wearing long sleeves, pants, a wide-brimmed hat, and sunglasses year round, whenever you are outdoors.  Notify your caregiver of new moles or changes in moles, especially if there is a change in shape or color. Also notify your caregiver if a mole is larger than the size of a pencil eraser.  Stay current with your immunizations. Document Released: 06/16/2011 Document Revised: 03/28/2013 Document Reviewed: 06/16/2011 [facility] Patient Information 2014 [employer].

## 2015-02-09 NOTE — Addendum Note (Signed)
Addended by: Nelva Nay on: 02/09/2015 04:57 PM   Modules accepted: Orders

## 2015-02-09 NOTE — Addendum Note (Signed)
Addended by: Thurnell Garbe A on: 02/09/2015 05:00 PM   Modules accepted: SmartSet

## 2015-02-09 NOTE — Progress Notes (Signed)
Colleen Gardner 23-Sep-1966 [id number]        49 y.o.  G2P2002 for annual exam.  Several issues noted below.  Past medical history,surgical history, problem list, medications, allergies, family history and social history were all reviewed and documented as reviewed in the EPIC chart.  ROS:  Performed with pertinent positives and negatives included in the history, assessment and plan.   Additional significant findings :  none   Exam: Kim Counsellor Vitals:   02/09/15 1612  BP: 112/66  Height: 5\' 6"  (1.676 m)  Weight: 118 lb (53.524 kg)   General appearance:  Normal affect, orientation and appearance. Skin: Grossly normal HEENT: Without gross lesions.  No cervical or supraclavicular adenopathy. Thyroid normal.  Lungs:  Clear without wheezing, rales or rhonchi Cardiac: RR, without RMG Abdominal:  Soft, nontender, without masses, guarding, rebound, organomegaly or hernia Breasts:  Examined lying and sitting without masses, retractions, discharge or axillary adenopathy. Pelvic:  Ext/BUS/vagina with slight white discharge. Minimal cystocele. First-degree uterine prolapse. Second degree rectocele  Cervix grossly normal  Uterus axial to anteverted, normal size, shape and contour, midline and mobile nontender   Adnexa  Without masses or tenderness    Anus and perineum  Normal   Rectovaginal  Normal sphincter tone without palpated masses or tenderness.    Assessment/Plan:  49 y.o. G46P2002 female for annual exam with monthly menses, vasectomy birth control.   1. Uterine prolapse/rectocele/mild cystocele. Patient does note something protruding from the vagina which on exam appears to be her rectocele. She does notice it particularly when she is up on her feet. Also having symptoms of mild issue I with loss of urine with coughing sneezing and laughing. Options for management including expectant management, pessary, surgery to include TVH, posterior colporrhaphy, possible anterior  colporrhaphy, possible sling. Options for referral to urology if considering sling reviewed. At this point patients not interested in pursuing surgery but wants to think about her options and will follow up if she chooses. Check urinalysis today. 2. Occasional hot flushes and night sweats. Seem to come in waves where she will have them for a week and then not have them for several weeks. Menses mildly irregular there'll come a little earlier or little later, little heavier little lighter.   Will check baseline FSH TSH. 3. Mammography fall 2015. Do not have a copy of this but she is adamant about having this done. We'll continue with annual mammography this coming fall. 4. Pap smear 2012. Pap/HPV today. No history of significant abnormal Pap smears. Plan repeat Pap smear at 3-5 year interval assuming this Pap smears normal. 5. Health maintenance. Baseline CBC comprehensive metabolic panel lipid profile urinalysis TSH FSH ordered. Follow up with decision as far surgery if she chooses otherwise 1 year for annual exam.     Anastasio Auerbach MD, 4:49 PM 02/09/2015

## 2015-02-10 LAB — COMPREHENSIVE METABOLIC PANEL
ALBUMIN: 3.7 g/dL (ref 3.5–5.2)
ALT: 9 U/L (ref 0–35)
AST: 15 U/L (ref 0–37)
Alkaline Phosphatase: 47 U/L (ref 39–117)
BILIRUBIN TOTAL: 0.4 mg/dL (ref 0.2–1.2)
BUN: 12 mg/dL (ref 6–23)
CALCIUM: 8.7 mg/dL (ref 8.4–10.5)
CHLORIDE: 103 meq/L (ref 96–112)
CO2: 25 meq/L (ref 19–32)
CREATININE: 0.87 mg/dL (ref 0.50–1.10)
GLUCOSE: 80 mg/dL (ref 70–99)
Potassium: 4.3 mEq/L (ref 3.5–5.3)
SODIUM: 138 meq/L (ref 135–145)
Total Protein: 6.5 g/dL (ref 6.0–8.3)

## 2015-02-10 LAB — URINALYSIS W MICROSCOPIC + REFLEX CULTURE
BACTERIA UA: NONE SEEN
Bilirubin Urine: NEGATIVE
Casts: NONE SEEN
Crystals: NONE SEEN
Glucose, UA: NEGATIVE mg/dL
Hgb urine dipstick: NEGATIVE
KETONES UR: NEGATIVE mg/dL
Leukocytes, UA: NEGATIVE
NITRITE: NEGATIVE
PH: 7.5 (ref 5.0–8.0)
Protein, ur: NEGATIVE mg/dL
Specific Gravity, Urine: 1.01 (ref 1.005–1.030)
Urobilinogen, UA: 0.2 mg/dL (ref 0.0–1.0)

## 2015-02-10 LAB — LIPID PANEL
CHOLESTEROL: 145 mg/dL (ref 0–200)
HDL: 70 mg/dL (ref >=46)
LDL CALC: 59 mg/dL (ref 0–99)
TRIGLYCERIDES: 79 mg/dL (ref <150)
Total CHOL/HDL Ratio: 2.1 Ratio
VLDL: 16 mg/dL (ref 0–40)

## 2015-02-10 LAB — FOLLICLE STIMULATING HORMONE: FSH: 9.5 m[IU]/mL

## 2015-02-14 LAB — CYTOLOGY - PAP

## 2015-03-22 ENCOUNTER — Ambulatory Visit (INDEPENDENT_AMBULATORY_CARE_PROVIDER_SITE_OTHER): Payer: 59 | Admitting: Gynecology

## 2015-03-22 ENCOUNTER — Encounter: Payer: Self-pay | Admitting: Gynecology

## 2015-03-22 VITALS — BP 114/70

## 2015-03-22 DIAGNOSIS — N816 Rectocele: Secondary | ICD-10-CM | POA: Diagnosis not present

## 2015-03-22 DIAGNOSIS — N8111 Cystocele, midline: Secondary | ICD-10-CM | POA: Diagnosis not present

## 2015-03-22 DIAGNOSIS — N814 Uterovaginal prolapse, unspecified: Secondary | ICD-10-CM

## 2015-03-22 NOTE — Progress Notes (Addendum)
Colleen Gardner 06/07/1966 [id number]        49 y.o.  G2P2002 presents to discuss wanting to proceed with hysterectomy.  She has a uterine prolapse/rectocele/cystocele. She is noticing worsening pressure and something protruding from the vagina. Also has occasional loss of urine with coughing and sneezing although she thought a lot about this and realizes that it really is not that common of an issue. She is decided that she wants to proceed with hysterectomy and wants to discuss this.  Past medical history,surgical history, problem list, medications, allergies, family history and social history were all reviewed and documented in the EPIC chart.  Directed ROS with pertinent positives and negatives documented in the history of present illness/assessment and plan.  Exam: Kim assistant Filed Vitals:   03/22/15 1059  BP: 114/70   General appearance:  Normal Abdomen soft nontender without masses guarding rebound Pelvic external BUS vagina with mild cystocele/rectocele. Mild uterine prolapse although did not have the patient strains significantly. Cervix normal. Uterus grossly normal in size midline mobile nontender. Adnexa without masses or tenderness.  Assessment/Plan:  49 y.o. F7P1025 with progressively more symptomatic pelvic relaxation.  We discussed her incontinence and she does not feel that it is significant at this point and does not want to pursue a sling. I offered referral to urology to discuss this and let them take a look at her but she is not interested in doing so. She recognizes that she may require the surgery in the future but at this point would prefer just to monitor. I would recommend proceeding with a TVH anterior and posterior colporrhaphy. I reviewed SGO recommendations to consider prophylactic salpingectomies for risk reductive surgery for "ovarian cancer". If we are able to achieve this vaginally then we'll proceed with this. If it would require a separate abdominal incision  such as laparoscopy/laparotomy then the patient would prefer that I just leave the fallopian tube segments and she would accept the risks and the future. I also reviewed the ovarian conservation issue and the benefits for continued hormone production versus removing her ovaries and the risks of symptoms, accelerated bone loss/cardiovascular risk and possible HRT. By keeping her ovaries risks of ovarian disease both benign and malignant reviewed. The patient prefers to keep both ovaries and would accept the risk of ovarian disease in the future. She understands her to use it we will be able to achieve the surgery vaginally and that she may require an abdominal incision either laparoscopic or TAH. I also reviewed the anatomy of the pelvis and she understands that we are operating around the bladder and rectum and that these organs are at increased risk of damage with subsequent repair surgeries necessary. Lastly I discussed her Leiden factor. She's never had a thrombosis but it was screened based on a family history. I will plan on Lovenox prophylaxis in the perioperative time frame. Patient will move toward scheduling her surgery and will represent for a full preoperative consult before hand.    Anastasio Auerbach MD, 11:24 AM 03/22/2015

## 2015-03-22 NOTE — Patient Instructions (Signed)
Office will call you to arrange surgery. 

## 2015-03-27 ENCOUNTER — Telehealth: Payer: Self-pay

## 2015-03-27 MED ORDER — ENOXAPARIN SODIUM 40 MG/0.4ML ~~LOC~~ SOLN: 40.0000 mg | SUBCUTANEOUS | Status: DC

## 2015-03-27 NOTE — Telephone Encounter (Signed)
Patient called back. We discussed dates and patient requested 05/22/15. Surgery scheduled and pre op consult scheduled as well.  Patient was instructed that she will received Lovenox at the hospital 2 hours prior and then she will need injection every day for 7 days following.  She has never administered this before so I told her when she picks up Rx to call and come by the office and one of the Med Assistants can teach her how. Rx sent to her pharmacy.  We discussed her insurance benefits and her estimated financial responsibility to [facility].  Financial letter will be sent.

## 2015-03-27 NOTE — Telephone Encounter (Signed)
Left message for patient to call me.  (I have surgery to schedule for her.)

## 2015-03-30 ENCOUNTER — Ambulatory Visit: Payer: 59 | Admitting: Gynecology

## 2015-03-30 ENCOUNTER — Telehealth: Payer: Self-pay

## 2015-03-30 NOTE — Telephone Encounter (Signed)
Patient faxed me her FMLA form and had a note with it stating that Dr. 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 I checked with Dr. 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  Patient advised of that and is fine with that. Form was completed and patient was advised of fee due before it is sent out. She will call back, when not driving, to make that payment.

## 2015-04-02 NOTE — Telephone Encounter (Signed)
Patient called back. Payment made. Forms were mailed out out to her this morning.

## 2015-04-13 ENCOUNTER — Encounter: Payer: Self-pay | Admitting: Gynecology

## 2015-04-24 ENCOUNTER — Telehealth: Payer: Self-pay

## 2015-04-24 NOTE — Telephone Encounter (Signed)
no

## 2015-04-24 NOTE — Telephone Encounter (Signed)
Patient informed. 

## 2015-04-24 NOTE — Telephone Encounter (Signed)
Scheduled for Vag hyst on 6/7. Patient anticipates menses around that time and asked if that would impact surgery?

## 2015-05-04 ENCOUNTER — Ambulatory Visit (INDEPENDENT_AMBULATORY_CARE_PROVIDER_SITE_OTHER): Payer: 59 | Admitting: Gynecology

## 2015-05-04 ENCOUNTER — Encounter: Payer: Self-pay | Admitting: Gynecology

## 2015-05-04 VITALS — BP 114/70

## 2015-05-04 DIAGNOSIS — N760 Acute vaginitis: Secondary | ICD-10-CM

## 2015-05-04 LAB — WET PREP FOR TRICH, YEAST, CLUE
CLUE CELLS WET PREP: NONE SEEN
Trich, Wet Prep: NONE SEEN
Yeast Wet Prep HPF POC: NONE SEEN

## 2015-05-04 MED ORDER — FLUCONAZOLE 150 MG PO TABS: 150.0000 mg | ORAL_TABLET | Freq: Once | ORAL | Status: DC

## 2015-05-04 NOTE — Patient Instructions (Signed)
Take the Diflucan pill daily for 3 days. Use the other 2 pills as needed for recurrent symptoms.

## 2015-05-04 NOTE — Progress Notes (Signed)
Colleen Gardner [birth year] [id number]        50 y.o.  G2P2002 presents with two-week history of recurrent vaginal itching and irritation. Used to courses of OTC antifungal with some improvement but still present. No odor. No urinary symptoms such as frequency dysuria urgency. No fever chills nausea vomiting.  Past medical history,surgical history, problem list, medications, allergies, family history and social history were all reviewed and documented in the EPIC chart.  Directed ROS with pertinent positives and negatives documented in the history of present illness/assessment and plan.  Exam: Kim assistant Filed Vitals:   05/04/15 1418  BP: 114/70   General appearance:  Normal Abdomen soft nontender without masses guarding rebound Pelvic external BUS vagina with white discharge. Cystocele/rectocele/uterine prolapse noted. Cervix grossly normal.  Uterus normal size midline mobile nontender.  Adnexa without masses or tenderness.  Assessment/Plan:  50 y.o. [id number]  With vaginal discharge refractile to OTC antifungal.  Wet prep is negative. Suspect partially treated yeast. Will treat with Diflucan 150 mg 3 days. Total of 5 given to have available for recurrent symptoms. Follow up if symptoms persist, worsen or recur. Follow up at her scheduled preoperative consult for her upcoming surgery.    Anastasio Auerbach MD, 3:02 PM 05/04/2015

## 2015-05-04 NOTE — Addendum Note (Signed)
Addended by: Nelva Nay on: 05/04/2015 04:21 PM   Modules accepted: Orders

## 2015-05-11 ENCOUNTER — Encounter (HOSPITAL_COMMUNITY): Payer: Self-pay

## 2015-05-11 ENCOUNTER — Encounter (HOSPITAL_COMMUNITY)
Admission: RE | Admit: 2015-05-11 | Discharge: 2015-05-11 | Disposition: A | Discharge status: Home / Self Care | Payer: 59 | Source: Ambulatory Visit | Attending: Gynecology | Admitting: Gynecology

## 2015-05-11 DIAGNOSIS — Z01818 Encounter for other preprocedural examination: Secondary | ICD-10-CM | POA: Diagnosis not present

## 2015-05-11 LAB — CBC
HCT: 41.8 % (ref 36.0–46.0)
HEMOGLOBIN: 14.5 g/dL (ref 12.0–15.0)
MCH: 33.2 pg (ref 26.0–34.0)
MCHC: 34.7 g/dL (ref 30.0–36.0)
MCV: 95.7 fL (ref 78.0–100.0)
Platelets: 173 10*3/uL (ref 150–400)
RBC: 4.37 MIL/uL (ref 3.87–5.11)
RDW: 12.2 % (ref 11.5–15.5)
WBC: 5.8 10*3/uL (ref 4.0–10.5)

## 2015-05-11 NOTE — Patient Instructions (Addendum)
   Your procedure is scheduled on: June 7 AT 730AM  Enter through the Main Entrance of [facility] at: 6AM  Pick up the phone at the desk and dial [phone] and inform us of your arrival.  Please call this number if you have any problems the morning of surgery: [phone]  Remember: Do not eat food after midnight: June 6 Do not drink clear liquids after: June 6 Take these medicines the morning of surgery with a SIP OF WATER:  Do not wear jewelry, make-up, or FINGER nail polish No metal in your hair or on your body. Do not wear lotions, powders, perfumes.  You may wear deodorant.  Do not bring valuables to the hospital. Contacts, dentures or bridgework may not be worn into surgery.  Leave suitcase in the car. After Surgery it may be brought to your room. For patients being admitted to the hospital, checkout time is 11:00am the day of discharge.

## 2015-05-18 ENCOUNTER — Ambulatory Visit (INDEPENDENT_AMBULATORY_CARE_PROVIDER_SITE_OTHER): Payer: 59 | Admitting: Gynecology

## 2015-05-18 ENCOUNTER — Telehealth: Payer: Self-pay

## 2015-05-18 ENCOUNTER — Encounter: Payer: Self-pay | Admitting: Gynecology

## 2015-05-18 VITALS — BP 122/82 | Ht 67.0 in | Wt 118.0 lb

## 2015-05-18 DIAGNOSIS — N814 Uterovaginal prolapse, unspecified: Secondary | ICD-10-CM | POA: Diagnosis not present

## 2015-05-18 DIAGNOSIS — N8111 Cystocele, midline: Secondary | ICD-10-CM

## 2015-05-18 DIAGNOSIS — N816 Rectocele: Secondary | ICD-10-CM | POA: Diagnosis not present

## 2015-05-18 MED ORDER — MEGESTROL ACETATE 20 MG PO TABS: 20.0000 mg | ORAL_TABLET | Freq: Every day | ORAL | Status: DC

## 2015-05-18 NOTE — Patient Instructions (Signed)
Take the Megace hormone pill daily through surgery.

## 2015-05-18 NOTE — H&P (Signed)
Colleen Gardner October 08, 1966 [id number]   History and Physical  Chief complaint: pelvic relaxation with uterine prolapse, cystocele, rectocele.   History of present illness: 49 year old G68P2 female, vasectomy birth control with worsening symptoms attributable to her pelvic relaxation. Patient has had an issue with cystocele, rectocele and uterine prolapse over the last several years but most recently has noted with standing something protruding from the vagina. Options for management to include observation, pessary and surgery were reviewed and the patient wants definitive surgical correction to include TVH, anterior-posterior colporrhaphy. She has had occasional stress incontinence type symptoms. Options to be evaluated for concomitant sling were discussed the patient does not want to do this as she does not feel her symptoms are significant enough in understands the possible need in the future.  Past medical history,surgical history, medications, allergies, family history and social history were all reviewed and documented in the EPIC chart.   ROS: Was performed and pertinent positives and negatives are included in the history of present illness.   Exam: Wandra Scot assistant  General: well developed, well nourished female, no acute distress  HEENT: normal  Lungs: clear to auscultation without wheezing, rales or rhonchi  Cardiac: regular rate without rubs, murmurs or gallops  Abdomen: soft, nontender without masses, guarding, rebound, organomegaly  Pelvic: external bus vagina: normal with second degree cystocele, second-degree rectocele, first to second-degree prolapse  Cervix: grossly normal  Uterus: normal size, midline and mobile, nontender  Adnexa: without masses or tenderness  Rectovaginal exam within normal limits excepting rectocele    Assessment/Plan: 49 year old G2P2 female vasectomy birth control with worsenings symptoms attributable to her pelvic relaxation. Options for  management reviewed to include observation, pessary and surgical intervention. Patient desires surgical intervention and is admitted for [facility] A&P repair. We discussed her symptoms of mild stress incontinence and options to include a urologic procedure such as sling reviewed with the patient prefers at this point not to do that and accepts the possibilities of surgery in the future for urinary incontinence.  The patient understands at any time during the procedure if I feel it is unsafe to proceed with the vaginal approach or compilations arise I will convert this to an abdominal approach which could mean a larger incision with a longer recovery period. The ovarian conservation issue was also reviewed and the options to remove both ovaries or keep both ovaries discussed. Given her younger age and no strong family history of ovarian cancer the patient desires to keep both ovaries for continued hormone production. She does give me permission to remove one or both ovaries if significant disease is encountered and it is my best recommendation interoperatively. We discussed SGO recommendations for prophylactic salpingectomy as a risk reductive surgery for "ovarian cancer". We will attempt to remove both fallopian tubes if possible vaginally. If this is not felt safe to do or technically impossible then we will leave them in place instead of making a separate incision abdominally to remove them and she is comfortable with this. She does understand by keeping her ovaries/fallopian tubes she is at risk for disease in the future to include ovarian cancer. The absolute and irreversible sterility of hysterectomy was also discussed. Sexuality following hysterectomy was reviewed to include the risk of persistent orgasmic dysfunction or dyspareunia following the procedure. Patient understands that we will be making vaginal incisions and that scarring may occur that could restrict the vagina and required dilatation afterwards. The  expected intraoperative and postoperative courses as well as the recovery period was  reviewed. The risks of infection, prolonged antibiotics, reoperation for abscess or hematoma formation was discussed. The risks of hemorrhage necessitating transfusion and the risks of transfusion reaction, hepatitis, HIV, mad cow disease and other unknown entities was also discussed. Incisional complications to include opening and draining of incisions and closure by secondary intention, dehiscence and long-term issues of keloid/cosmetics and hernia formation were reviewed. The risk of inadvertent injury to internal organs including bowel, bladder, ureters, vessels, nerves either immediately recognized or delay recognized necessitating major exploratory reparative surgeries and future reparative surgeries including bowel resection, ostomy formation, bladder repair, ureteral damage repair was discussed with her. The anatomy of the area was reviewed and she understands we will be working very closely with the bladder/ureter/rectum and a realistic understanding of the risk of damage to the structures was discussed. I also reviewed with her there are no guarantees that this is a lifelong repair and that she is at risk for recurrence of both the cystocele or rectocele and a vaginal vault prolapse which may require surgeries in the future. The issues of using mesh as a primary repair mode and its risks to include erosion and complications in the future was also discussed. The patient does have Leiden factor V mutation although never had a thrombosis and we will plan on Lovenox prophylaxis around the time of surgery. The patient's questions were answered to her satisfaction and she is ready to proceed with surgery.      Anastasio Auerbach MD, 3:21 PM 05/18/2015

## 2015-05-18 NOTE — Progress Notes (Signed)
Colleen Gardner 12/10/1966 [id number]  Preoperative consult  Chief complaint: pelvic relaxation with uterine prolapse, cystocele, rectocele.   History of present illness: 49 year old G70P2 female, vasectomy birth control with worsening symptoms attributable to her pelvic relaxation. Patient has had an issue with cystocele, rectocele and uterine prolapse over the last several years but most recently has noted with standing something protruding from the vagina. Options for management to include observation, pessary and surgery were reviewed and the patient wants definitive surgical correction to include TVH, anterior-posterior colporrhaphy. She has had occasional stress incontinence type symptoms. Options to be evaluated for concomitant sling were discussed the patient does not want to do this as she does not feel her symptoms are significant enough in understands the possible need in the future.  Past medical history,surgical history, medications, allergies, family history and social history were all reviewed and documented in the EPIC chart.   ROS: Was performed and pertinent positives and negatives are included in the history of present illness.   Exam: Wandra Scot assistant  General: well developed, well nourished female, no acute distress  HEENT: normal  Lungs: clear to auscultation without wheezing, rales or rhonchi  Cardiac: regular rate without rubs, murmurs or gallops  Abdomen: soft, nontender without masses, guarding, rebound, organomegaly  Pelvic: external bus vagina: normal with second degree cystocele, second-degree rectocele, first to second-degree prolapse  Cervix: grossly normal  Uterus: normal size, midline and mobile, nontender  Adnexa: without masses or tenderness  Rectovaginal exam within normal limits excepting rectocele    Assessment/Plan: 49 year old G2P2 female vasectomy birth control with worsenings symptoms attributable to her pelvic relaxation. Options for management  reviewed to include observation, pessary and surgical intervention. Patient desires surgical intervention and is admitted for [facility] A&P repair. We discussed her symptoms of mild stress incontinence and options to include a urologic procedure such as sling reviewed with the patient prefers at this point not to do that and accepts the possibilities of surgery in the future for urinary incontinence.  The patient understands at any time during the procedure if I feel it is unsafe to proceed with the vaginal approach or compilations arise I will convert this to an abdominal approach which could mean a larger incision with a longer recovery period. The ovarian conservation issue was also reviewed and the options to remove both ovaries or keep both ovaries discussed. Given her younger age and no strong family history of ovarian cancer the patient desires to keep both ovaries for continued hormone production. She does give me permission to remove one or both ovaries if significant disease is encountered and it is my best recommendation interoperatively. We discussed SGO recommendations for prophylactic salpingectomy as a risk reductive surgery for "ovarian cancer". We will attempt to remove both fallopian tubes if possible vaginally. If this is not felt safe to do or technically impossible then we will leave them in place instead of making a separate incision abdominally to remove them and she is comfortable with this. She does understand by keeping her ovaries/fallopian tubes she is at risk for disease in the future to include ovarian cancer. The absolute and irreversible sterility of hysterectomy was also discussed. Sexuality following hysterectomy was reviewed to include the risk of persistent orgasmic dysfunction or dyspareunia following the procedure. Patient understands that we will be making vaginal incisions and that scarring may occur that could restrict the vagina and required dilatation afterwards. The expected  intraoperative and postoperative courses as well as the recovery period was reviewed. The  risks of infection, prolonged antibiotics, reoperation for abscess or hematoma formation was discussed. The risks of hemorrhage necessitating transfusion and the risks of transfusion reaction, hepatitis, HIV, mad cow disease and other unknown entities was also discussed. Incisional complications to include opening and draining of incisions and closure by secondary intention, dehiscence and long-term issues of keloid/cosmetics and hernia formation were reviewed. The risk of inadvertent injury to internal organs including bowel, bladder, ureters, vessels, nerves either immediately recognized or delay recognized necessitating major exploratory reparative surgeries and future reparative surgeries including bowel resection, ostomy formation, bladder repair, ureteral damage repair was discussed with her. The anatomy of the area was reviewed and she understands we will be working very closely with the bladder/ureter/rectum and a realistic understanding of the risk of damage to the structures was discussed. I also reviewed with her there are no guarantees that this is a lifelong repair and that she is at risk for recurrence of both the cystocele or rectocele and a vaginal vault prolapse which may require surgeries in the future. The issues of using mesh as a primary repair mode and its risks to include erosion and complications in the future was also discussed. The patient does have Leiden factor V mutation although never had a thrombosis and we will plan on Lovenox prophylaxis around the time of surgery. The patient's questions were answered to her satisfaction and she is ready to proceed with surgery.     Anastasio Auerbach MD, 3:14 PM 05/18/2015

## 2015-05-18 NOTE — Telephone Encounter (Signed)
Patient called stating she thought she remembered Dr. Loetta Rough mentioning several weeks ago about having her take a laxative prior to surgery.  I checked with Dr. Loetta Rough and he recommended she use an enema the morning of surgery. Patient was advised.

## 2015-05-19 NOTE — Anesthesia Preprocedure Evaluation (Addendum)
Anesthesia Evaluation  Patient identified by MRN, date of birth, ID band Patient awake    Reviewed: Allergy & Precautions, NPO status , Patient's Chart, lab work & pertinent test results, reviewed documented beta blocker date and time   Airway Mallampati: II   Neck ROM: Full    Dental  (+) Teeth Intact, Dental Advisory Given   Pulmonary neg pulmonary ROS,  breath sounds clear to auscultation        Cardiovascular + Valvular Problems/Murmurs MVP Rhythm:Regular     Neuro/Psych    GI/Hepatic negative GI ROS, Neg liver ROS,   Endo/Other  negative endocrine ROS  Renal/GU negative Renal ROS     Musculoskeletal   Abdominal (+)  Abdomen: soft.    Peds  Hematology 14/41   Anesthesia Other Findings   Reproductive/Obstetrics                            Anesthesia Physical Anesthesia Plan  ASA: II  Anesthesia Plan: General   Post-op Pain Management:    Induction: Intravenous  Airway Management Planned: LMA and Oral ETT  Additional Equipment:   Intra-op Plan:   Post-operative Plan: Extubation in OR  Informed Consent: I have reviewed the patients History and Physical, chart, labs and discussed the procedure including the risks, benefits and alternatives for the proposed anesthesia with the patient or authorized representative who has indicated his/her understanding and acceptance.     Plan Discussed with:   Anesthesia Plan Comments:         Anesthesia Quick Evaluation

## 2015-05-21 MED ORDER — DEXTROSE 5 % IV SOLN
2.0000 g | INTRAVENOUS | Status: DC
  Filled 2015-05-21: qty 2

## 2015-05-21 MED ORDER — ENOXAPARIN SODIUM 40 MG/0.4ML ~~LOC~~ SOLN
40.0000 mg | SUBCUTANEOUS | Status: AC
  Administered 2015-05-22: 40 mg via SUBCUTANEOUS
  Filled 2015-05-21: qty 0.4

## 2015-05-21 MED ORDER — ENOXAPARIN SODIUM 40 MG/0.4ML ~~LOC~~ SOLN
40.0000 mg | SUBCUTANEOUS | Status: DC
  Filled 2015-05-21: qty 0.4

## 2015-05-22 ENCOUNTER — Ambulatory Visit (HOSPITAL_COMMUNITY): Payer: 59 | Admitting: Anesthesiology

## 2015-05-22 ENCOUNTER — Ambulatory Visit (HOSPITAL_COMMUNITY)
Admission: RE | Admit: 2015-05-22 | Discharge: 2015-05-23 | Disposition: A | Discharge status: Home / Self Care | Payer: 59 | Source: Ambulatory Visit | Attending: Gynecology | Admitting: Gynecology

## 2015-05-22 ENCOUNTER — Encounter (HOSPITAL_COMMUNITY)
Admission: RE | Disposition: A | Discharge status: Home / Self Care | Payer: Self-pay | Source: Ambulatory Visit | Attending: Gynecology

## 2015-05-22 DIAGNOSIS — N816 Rectocele: Secondary | ICD-10-CM | POA: Insufficient documentation

## 2015-05-22 DIAGNOSIS — N814 Uterovaginal prolapse, unspecified: Secondary | ICD-10-CM | POA: Diagnosis not present

## 2015-05-22 DIAGNOSIS — D259 Leiomyoma of uterus, unspecified: Secondary | ICD-10-CM | POA: Diagnosis not present

## 2015-05-22 DIAGNOSIS — N8111 Cystocele, midline: Secondary | ICD-10-CM

## 2015-05-22 DIAGNOSIS — N800 Endometriosis of uterus: Secondary | ICD-10-CM | POA: Diagnosis not present

## 2015-05-22 HISTORY — PX: VAGINAL HYSTERECTOMY: SHX2639

## 2015-05-22 HISTORY — PX: ANTERIOR AND POSTERIOR REPAIR: SHX5121

## 2015-05-22 HISTORY — PX: BILATERAL SALPINGECTOMY: SHX5743

## 2015-05-22 LAB — CREATININE, SERUM
Creatinine, Ser: 0.86 mg/dL (ref 0.44–1.00)
GFR calc Af Amer: >60 mL/min (ref >60)
GFR calc non Af Amer: >60 mL/min (ref >60)

## 2015-05-22 LAB — HCG, SERUM, QUALITATIVE: PREG SERUM: NEGATIVE

## 2015-05-22 SURGERY — HYSTERECTOMY, VAGINAL
Anesthesia: General | Site: Vagina

## 2015-05-22 MED ORDER — ENOXAPARIN (LOVENOX) PATIENT EDUCATION KIT
PACK | Freq: Once | Status: AC
  Administered 2015-05-22: 18:00:00
  Filled 2015-05-22: qty 1

## 2015-05-22 MED ORDER — DEXAMETHASONE SODIUM PHOSPHATE 4 MG/ML IJ SOLN
INTRAMUSCULAR | Status: DC | PRN
  Administered 2015-05-22: 10 mg via INTRAVENOUS

## 2015-05-22 MED ORDER — DEXTROSE 5 % IV SOLN
2.0000 g | INTRAVENOUS | Status: DC | PRN
  Administered 2015-05-22: 2 g via INTRAVENOUS

## 2015-05-22 MED ORDER — LIDOCAINE-EPINEPHRINE 1 %-1:100000 IJ SOLN
INTRAMUSCULAR | Status: DC | PRN
  Administered 2015-05-22: 10 mL

## 2015-05-22 MED ORDER — PROMETHAZINE HCL 25 MG/ML IJ SOLN: 6.2500 mg | INTRAMUSCULAR | Status: DC | PRN

## 2015-05-22 MED ORDER — FENTANYL CITRATE (PF) 250 MCG/5ML IJ SOLN
INTRAMUSCULAR | Status: AC
  Filled 2015-05-22: qty 5

## 2015-05-22 MED ORDER — DIPHENHYDRAMINE HCL 25 MG PO CAPS: 50.0000 mg | ORAL_CAPSULE | Freq: Four times a day (QID) | ORAL | Status: DC | PRN

## 2015-05-22 MED ORDER — MIDAZOLAM HCL 5 MG/5ML IJ SOLN
INTRAMUSCULAR | Status: DC | PRN
  Administered 2015-05-22: 2 mg via INTRAVENOUS

## 2015-05-22 MED ORDER — NEOSTIGMINE METHYLSULFATE 10 MG/10ML IV SOLN
INTRAVENOUS | Status: DC | PRN
  Administered 2015-05-22: 3 mg via INTRAVENOUS

## 2015-05-22 MED ORDER — ONDANSETRON HCL 4 MG/2ML IJ SOLN
INTRAMUSCULAR | Status: AC
  Filled 2015-05-22: qty 2

## 2015-05-22 MED ORDER — MIDAZOLAM HCL 2 MG/2ML IJ SOLN
INTRAMUSCULAR | Status: AC
  Filled 2015-05-22: qty 2

## 2015-05-22 MED ORDER — GLYCOPYRROLATE 0.2 MG/ML IJ SOLN
INTRAMUSCULAR | Status: AC
  Filled 2015-05-22: qty 1

## 2015-05-22 MED ORDER — ESTRADIOL 0.1 MG/GM VA CREA
TOPICAL_CREAM | VAGINAL | Status: AC
  Filled 2015-05-22: qty 42.5

## 2015-05-22 MED ORDER — OXYCODONE-ACETAMINOPHEN 5-325 MG PO TABS
1.0000 | ORAL_TABLET | ORAL | Status: DC | PRN
  Administered 2015-05-22 (×2): 2 via ORAL
  Administered 2015-05-23: 1 via ORAL
  Filled 2015-05-22: qty 1
  Filled 2015-05-22 (×2): qty 2

## 2015-05-22 MED ORDER — GLYCOPYRROLATE 0.2 MG/ML IJ SOLN
INTRAMUSCULAR | Status: AC
  Filled 2015-05-22: qty 3

## 2015-05-22 MED ORDER — LIDOCAINE-EPINEPHRINE 1 %-1:100000 IJ SOLN
INTRAMUSCULAR | Status: AC
  Filled 2015-05-22: qty 1

## 2015-05-22 MED ORDER — LIDOCAINE HCL (CARDIAC) 20 MG/ML IV SOLN
INTRAVENOUS | Status: AC
  Filled 2015-05-22: qty 5

## 2015-05-22 MED ORDER — LIDOCAINE HCL (CARDIAC) 20 MG/ML IV SOLN
INTRAVENOUS | Status: DC | PRN
  Administered 2015-05-22 (×2): 50 mg via INTRAVENOUS

## 2015-05-22 MED ORDER — PROPOFOL 10 MG/ML IV BOLUS
INTRAVENOUS | Status: AC
  Filled 2015-05-22: qty 20

## 2015-05-22 MED ORDER — FENTANYL CITRATE (PF) 100 MCG/2ML IJ SOLN
INTRAMUSCULAR | Status: DC | PRN
  Administered 2015-05-22: 100 ug via INTRAVENOUS
  Administered 2015-05-22: 50 ug via INTRAVENOUS
  Administered 2015-05-22: 100 ug via INTRAVENOUS

## 2015-05-22 MED ORDER — ONDANSETRON HCL 4 MG PO TABS: 4.0000 mg | ORAL_TABLET | Freq: Four times a day (QID) | ORAL | Status: DC | PRN

## 2015-05-22 MED ORDER — GLYCOPYRROLATE 0.2 MG/ML IJ SOLN
INTRAMUSCULAR | Status: DC | PRN
  Administered 2015-05-22: 0.6 mg via INTRAVENOUS
  Administered 2015-05-22: 0.1 mg via INTRAVENOUS

## 2015-05-22 MED ORDER — LACTATED RINGERS IV SOLN
INTRAVENOUS | Status: DC
  Administered 2015-05-22: 125 mL/h via INTRAVENOUS
  Administered 2015-05-22 (×2): via INTRAVENOUS

## 2015-05-22 MED ORDER — SCOPOLAMINE 1 MG/3DAYS TD PT72
1.0000 | MEDICATED_PATCH | Freq: Once | TRANSDERMAL | Status: DC
  Administered 2015-05-22: 1.5 mg via TRANSDERMAL

## 2015-05-22 MED ORDER — ROCURONIUM BROMIDE 100 MG/10ML IV SOLN
INTRAVENOUS | Status: AC
  Filled 2015-05-22: qty 1

## 2015-05-22 MED ORDER — MEPERIDINE HCL 25 MG/ML IJ SOLN: 6.2500 mg | INTRAMUSCULAR | Status: DC | PRN

## 2015-05-22 MED ORDER — SCOPOLAMINE 1 MG/3DAYS TD PT72
MEDICATED_PATCH | TRANSDERMAL | Status: AC
  Administered 2015-05-22: 1.5 mg via TRANSDERMAL
  Filled 2015-05-22: qty 1

## 2015-05-22 MED ORDER — ONDANSETRON HCL 4 MG/2ML IJ SOLN
INTRAMUSCULAR | Status: DC | PRN
  Administered 2015-05-22: 4 mg via INTRAVENOUS

## 2015-05-22 MED ORDER — ROCURONIUM BROMIDE 100 MG/10ML IV SOLN
INTRAVENOUS | Status: DC | PRN
  Administered 2015-05-22: 5 mg via INTRAVENOUS
  Administered 2015-05-22: 40 mg via INTRAVENOUS
  Administered 2015-05-22: 20 mg via INTRAVENOUS

## 2015-05-22 MED ORDER — FENTANYL CITRATE (PF) 100 MCG/2ML IJ SOLN
25.0000 ug | INTRAMUSCULAR | Status: DC | PRN
  Administered 2015-05-22 (×4): 25 ug via INTRAVENOUS

## 2015-05-22 MED ORDER — DEXTROSE-NACL 5-0.9 % IV SOLN
INTRAVENOUS | Status: DC
  Administered 2015-05-22 – 2015-05-23 (×2): via INTRAVENOUS

## 2015-05-22 MED ORDER — PROPOFOL INFUSION 10 MG/ML OPTIME
INTRAVENOUS | Status: DC | PRN
  Administered 2015-05-22: 12 mL via INTRAVENOUS

## 2015-05-22 MED ORDER — MORPHINE SULFATE 4 MG/ML IJ SOLN
1.0000 mg | INTRAMUSCULAR | Status: DC | PRN
  Administered 2015-05-22: 2 mg via INTRAVENOUS
  Filled 2015-05-22: qty 1

## 2015-05-22 MED ORDER — DEXAMETHASONE SODIUM PHOSPHATE 10 MG/ML IJ SOLN
INTRAMUSCULAR | Status: AC
  Filled 2015-05-22: qty 1

## 2015-05-22 MED ORDER — ONDANSETRON HCL 4 MG/2ML IJ SOLN: 4.0000 mg | Freq: Four times a day (QID) | INTRAMUSCULAR | Status: DC | PRN

## 2015-05-22 MED ORDER — ENOXAPARIN SODIUM 40 MG/0.4ML ~~LOC~~ SOLN
40.0000 mg | SUBCUTANEOUS | Status: DC
  Administered 2015-05-23: 40 mg via SUBCUTANEOUS
  Filled 2015-05-22: qty 0.4

## 2015-05-22 MED ORDER — ESTRADIOL 0.1 MG/GM VA CREA
TOPICAL_CREAM | VAGINAL | Status: DC | PRN
  Administered 2015-05-22: 1 via VAGINAL

## 2015-05-22 MED ORDER — FENTANYL CITRATE (PF) 100 MCG/2ML IJ SOLN
INTRAMUSCULAR | Status: AC
  Filled 2015-05-22: qty 2

## 2015-05-22 SURGICAL SUPPLY — 30 items
CANISTER SUCT 3000ML (MISCELLANEOUS) ×4 IMPLANT
CLOTH BEACON ORANGE TIMEOUT ST (SAFETY) ×4 IMPLANT
CONT PATH 16OZ SNAP LID 3702 (MISCELLANEOUS) IMPLANT
CONTAINER PREFILL 10% NBF 60ML (FORM) IMPLANT
DECANTER SPIKE VIAL GLASS SM (MISCELLANEOUS) ×4 IMPLANT
DRSG TELFA 3X8 NADH (GAUZE/BANDAGES/DRESSINGS) ×4 IMPLANT
GAUZE PACKING 2X5 YD STRL (GAUZE/BANDAGES/DRESSINGS) IMPLANT
GLOVE BIO SURGEON STRL SZ7.5 (GLOVE) ×4 IMPLANT
GLOVE BIOGEL PI IND STRL 6.5 (GLOVE) ×2 IMPLANT
GLOVE BIOGEL PI INDICATOR 6.5 (GLOVE) ×2
GOWN STRL REUS W/TWL LRG LVL3 (GOWN DISPOSABLE) ×16 IMPLANT
NEEDLE HYPO 22GX1.5 SAFETY (NEEDLE) IMPLANT
NEEDLE MAYO .5 CIRCLE (NEEDLE) IMPLANT
NEEDLE SPNL 18GX3.5 QUINCKE PK (NEEDLE) ×4 IMPLANT
NEEDLE SPNL 22GX3.5 QUINCKE BK (NEEDLE) IMPLANT
NS IRRIG 1000ML POUR BTL (IV SOLUTION) ×4 IMPLANT
PACK VAGINAL WOMENS (CUSTOM PROCEDURE TRAY) ×4 IMPLANT
PAD OB MATERNITY 4.3X12.25 (PERSONAL CARE ITEMS) ×4 IMPLANT
SUT VIC AB 0 CT1 18XCR BRD8 (SUTURE) ×6 IMPLANT
SUT VIC AB 0 CT1 36 (SUTURE) ×4 IMPLANT
SUT VIC AB 0 CT1 8-18 (SUTURE) ×6
SUT VIC AB 0 CT2 27 (SUTURE) IMPLANT
SUT VIC AB 2-0 SH 27 (SUTURE) ×20
SUT VIC AB 2-0 SH 27XBRD (SUTURE) ×20 IMPLANT
SUT VICRYL 0 TIES 12 18 (SUTURE) ×4 IMPLANT
SUT VICRYL 3 0 BR 18  UND (SUTURE) ×2
SUT VICRYL 3 0 BR 18 UND (SUTURE) ×2 IMPLANT
TOWEL OR 17X24 6PK STRL BLUE (TOWEL DISPOSABLE) ×8 IMPLANT
TRAY FOLEY CATH SILVER 14FR (SET/KITS/TRAYS/PACK) ×4 IMPLANT
WATER STERILE IRR 1000ML POUR (IV SOLUTION) ×4 IMPLANT

## 2015-05-22 NOTE — Transfer of Care (Signed)
Immediate Anesthesia Transfer of Care Note  Patient: Colleen Gardner  Procedure(s) Performed: Procedure(s): HYSTERECTOMY VAGINAL (N/A) BILATERAL SALPINGECTOMY (Bilateral) ANTERIOR (CYSTOCELE) AND POSTERIOR REPAIR (RECTOCELE) (N/A)  Patient Location: PACU  Anesthesia Type:General  Level of Consciousness: awake, alert  and oriented  Airway & Oxygen Therapy: Patient Spontanous Breathing and Patient connected to nasal cannula oxygen  Post-op Assessment: Report given to RN and Post -op Vital signs reviewed and stable  Post vital signs: Reviewed and stable  Last Vitals:  Filed Vitals:   05/22/15 0629  BP: 119/71  Pulse: 66  Temp: 36.7 C  Resp: 18    Complications: No apparent anesthesia complications

## 2015-05-22 NOTE — H&P (Signed)
  The patient was examined.  I reviewed the proposed surgery and consent form with the patient.  The dictated history and physical is current and accurate and all questions were answered. The patient is ready to proceed with surgery and has a realistic understanding and expectation for the outcome.   Anastasio Auerbach MD, 6:59 AM 05/22/2015

## 2015-05-22 NOTE — Anesthesia Postprocedure Evaluation (Signed)
Anesthesia Post Note  Patient: Colleen Gardner  Procedure(s) Performed: Procedure(s) (LRB): HYSTERECTOMY VAGINAL (N/A) BILATERAL SALPINGECTOMY (Bilateral) ANTERIOR (CYSTOCELE) AND POSTERIOR REPAIR (RECTOCELE) (N/A)  Anesthesia type: General  Patient location: Mother/Baby  Post pain: Pain level controlled  Post assessment: Post-op Vital signs reviewed  Last Vitals:  Filed Vitals:   05/22/15 1115  BP: 101/58  Pulse: 73  Temp: 36.9 C  Resp: 16    Post vital signs: Reviewed  Level of consciousness: awake and alert   Complications: No apparent anesthesia complications

## 2015-05-22 NOTE — Anesthesia Procedure Notes (Signed)
Procedure Name: Intubation Date/Time: 05/22/2015 7:26 AM Performed by: Vernice Jefferson Pre-anesthesia Checklist: Patient identified, Emergency Drugs available, Suction available, Patient being monitored and Timeout performed Patient Re-evaluated:Patient Re-evaluated prior to inductionOxygen Delivery Method: Circle system utilized Preoxygenation: Pre-oxygenation with 100% oxygen Intubation Type: IV induction Ventilation: Mask ventilation without difficulty Laryngoscope Size: Mac and 3 Grade View: Grade II Tube type: Oral Number of attempts: 1 Airway Equipment and Method: Stylet Placement Confirmation: ETT inserted through vocal cords under direct vision,  positive ETCO2 and breath sounds checked- equal and bilateral Secured at: 21 cm Tube secured with: Tape Dental Injury: Teeth and Oropharynx as per pre-operative assessment

## 2015-05-22 NOTE — Progress Notes (Signed)
Patient ID: Colleen Gardner, female   DOB: 02-Mar-1966, 49 y.o.   MRN: [id number] Colleen Gardner 01-Aug-1966 [id number]   Day of Surgery s/p Procedure(s): HYSTERECTOMY VAGINAL BILATERAL SALPINGECTOMY ANTERIOR (CYSTOCELE) AND POSTERIOR REPAIR (RECTOCELE)  Subjective: Patient reports no acute distress, pain severity reported mild and moderate, Yes.   taking PO, foley catheter in place, No. ambulating, No. passing flatus  Objective: Vital signs in last 24 hours: Temp:  [97.7 F (36.5 C)-98.7 F (37.1 C)] 98.5 F (36.9 C) (06/07 1115) Pulse Rate:  [66-88] 73 (06/07 1115) Resp:  [12-18] 16 (06/07 1115) BP: (79-119)/(52-71) 101/58 mmHg (06/07 1115) SpO2:  [94 %-100 %] 100 % (06/07 1115) Weight:  [118 lb (53.524 kg)] 118 lb (53.524 kg) (06/07 1115) Last BM Date: 05/21/15  EXAM General: awake, alert and no distress Resp: clear to auscultation bilaterally Cardio: regular rate and rhythm GI: soft, minimal tenderness, bowel sounds active Lower Extremities: Without swelling or tenderness Vaginal Bleeding: none  Lab Results:  No results for input(s): WBC, HGB, HCT, PLT in the last 72 hours.  Assessment: s/p Procedure(s): HYSTERECTOMY VAGINAL BILATERAL SALPINGECTOMY ANTERIOR (CYSTOCELE) AND POSTERIOR REPAIR (RECTOCELE): stable and progressing well  Plan: Continue routine post operative care. Reviewed results of surgery with the patient and husband.  Advance routine PO care as tolerated with planned discharge tomorrow AM after removing vaginal packing/foley.  Routine PO precautions, instructions and follow up reviewed with them to include Lovenox use.Anastasio Auerbach MD, 5:36 PM 05/22/2015

## 2015-05-22 NOTE — Op Note (Signed)
Colleen Gardner 10-Mar-1966 [id number]   Post Operative Note   Date of surgery:  05/22/2015  Pre Op Dx:  Cystocele, rectocele, uterine prolapse  Post Op Dx:  Cystocele, rectocele, uterine prolapse  Procedure:  Total vaginal hysterectomy, bilateral salpingectomy, anterior colporrhaphy, posterior colporrhaphy  Surgeon:  Anastasio Auerbach  Assistant:  Uvaldo Rising  Anesthesia:  General  EBL:  75 cc  Complications:  None  Specimen:  Uterus and bilateral fallopian tubes to pathology  Findings: EUA:  External BUS vagina with cystocele/rectocele/uterine prolapse. Cervix grossly normal. Uterus normal size midline mobile. Adnexa without masses   Operative:  Uterus, bilateral ovaries, bilateral fallopian tubes grossly normal. Cul-de-sac grossly normal limited inspection  Procedure:  The patient was taken to the operating room, placed in the low dorsal lithotomy position, underwent general anesthesia and received a vaginal/perineal preparation with Betadine solution per nursing personnel. The timeout was performed by the surgical team. An EUA was performed and a Foley catheter was placed in sterile technique. The patient was draped in usual fashion and the cervix was visualized with a weighted speculum, grasped with a Jacobs tenaculum and the cervical mucosa was circumferentially injected using 1% lidocaine with 1:100,000 dilution epinephrine. The cervical mucosa was then circumferentially incised and the paracervical planes were sharply developed. The anterior vesicouterine plane was sharply and bluntly developed and the anterior cul-de-sac was ultimately entered without difficulty. The posterior cul-de-sac was then entered directly without difficulty and a long weighted speculum was placed, the right and left uterosacral ligaments identified, clamped cut and ligated using 0 Vicryl suture and tagged for future reference. The uterus was then progressively freed from its attachments through clamping,  cutting and ligating the cardinal ligaments and parametrial tissues to the level of the uterine vessels using 0 Vicryl suture in interrupted stitch. The uterus was then delivered through the vagina and the uterine-ovarian pedicles bilaterally were clamped and cut and the specimen was removed and sent to pathology.  The uterine-ovarian pedicles were then doubly ligated using 0 Vicryl suture. The bowel was then packed from the cul-de-sac using a tagged tail sponge, the right and left adnexa inspected showing normal-appearing ovaries and fallopian tubes. The left fallopian tube was then grasped and elevated and clamped along the mesosalpinx and the fallopian tube was removed without difficulty. The mesosalpinx was ligated using 0 Vicryl suture in an interrupted stitch. A similar procedure was carried out on the other side. The long weighted speculum was then replaced with a shorter weighted speculum and the posterior vaginal cuff was run from uterosacral ligament to uterosacral ligament using 0 Vicryl suture securing the upper vaginal cuff to the uterosacral ligaments bilaterally for vaginal cuff support. Attention was then turned to the anterior colporrhaphy and the vaginal cuff mucosa was grasped with Allis clamps, the midline identified and the vaginal mucosa was progressively sharply incised from the cuff to 2 finger breaths below the urethral opening. Sequential Allis clamps were placed along the cut vaginal mucosa and through sharp and blunt dissection the vesicovaginal plane was developed to free the bladder from the underlying vaginal mucosa. Of note pubovaginal fascia was clearly noted bilaterally. The cystocele was then reduced through sequential interrupted 2-0 Vicryl sutures reapproximating the pubovaginal fascia noting good support of the cystocele.  The excess vaginal mucosa was then excised and the vaginal mucosa was reapproximated using 2-0 Vicryl suture in a running interlocking stitch incorporating  the underlying tissue to close the dead space. The vaginal cuff was then reapproximated anterior to  posterior using 0 Vicryl suture in interrupted figure-of-eight stitch. Attention was then turned to the rectocele repair and  Allis clamps were applied bilaterally to the opening to the vagina and the intervening posterior fourchette tissue was incised transversely between the clamps. The posterior vaginal mucosa was then sharply incised caudal to cephalad progressively applying Allis clamps to within 1 fingerbreadth below the closed vaginal cuff. Allis clamps were applied along the vaginal mucosa and using sharp and blunt dissection the pararectal tissues were freed from the overlying vaginal mucosa reducing the rectocele. Using 2-0 Vicryl sutures in interrupted stitch the perirectal tissues were reapproximated sequentially to reduce the rectocele. The excess vaginal mucosa was then excised and the vagina was then closed using 2-0 Vicryl suture in a running interlocking stitch. Support was given to the perineum with a interrupted 0 Vicryl suture subcutaneously and the posterior fourchette/external perineal skin was reapproximated using 2-0 Vicryl and interrupted cutaneous stitch. The vagina was irrigated showing adequate hemostasis and vaginal packing with Estrace vaginal cream was inserted. There was reported 150 cc of clear yellow urine throughout the case. Patient was awakened without difficulty and taken to recovery in good condition having tolerated procedure well.     Anastasio Auerbach MD, 9:50 AM 05/22/2015

## 2015-05-22 NOTE — Anesthesia Postprocedure Evaluation (Signed)
  Anesthesia Post-op Note  Patient: Colleen Gardner  Procedure(s) Performed: Procedure(s): HYSTERECTOMY VAGINAL (N/A) BILATERAL SALPINGECTOMY (Bilateral) ANTERIOR (CYSTOCELE) AND POSTERIOR REPAIR (RECTOCELE) (N/A)  Patient Location: PACU  Anesthesia Type:General  Level of Consciousness: awake, alert  and oriented  Airway and Oxygen Therapy: Patient Spontanous Breathing  Post-op Pain: mild  Post-op Assessment: Post-op Vital signs reviewed, Patient's Cardiovascular Status Stable, Respiratory Function Stable, Patent Airway and No signs of Nausea or vomiting  Post-op Vital Signs: Reviewed and stable  Last Vitals:  Filed Vitals:   05/22/15 1015  BP: 98/57  Pulse: 70  Temp:   Resp: 12    Complications: No apparent anesthesia complications

## 2015-05-22 NOTE — Addendum Note (Signed)
Addendum  created 05/22/15 1326 by Talbot Grumbling, CRNA   Modules edited: Notes Section   Notes Section:  File: [id number]

## 2015-05-23 ENCOUNTER — Encounter (HOSPITAL_COMMUNITY): Payer: Self-pay | Admitting: *Deleted

## 2015-05-23 DIAGNOSIS — D259 Leiomyoma of uterus, unspecified: Secondary | ICD-10-CM | POA: Diagnosis not present

## 2015-05-23 MED ORDER — OXYCODONE-ACETAMINOPHEN 5-325 MG PO TABS: 1.0000 | ORAL_TABLET | ORAL | Status: DC | PRN

## 2015-05-23 NOTE — Progress Notes (Addendum)
Colleen Gardner 07-23-1966 [id number]   1 Day Post-Op Procedure(s) (LRB): HYSTERECTOMY VAGINAL (N/A) BILATERAL SALPINGECTOMY (Bilateral) ANTERIOR (CYSTOCELE) AND POSTERIOR REPAIR (RECTOCELE) (N/A)  Subjective: Patient reports feels well, no complaints, pain severity reported mild, Yes.   taking PO, foley catheter in place, Yes.   ambulating, Yes.   passing flatus  Objective: Vital signs in last 24 hours: Temp:  [97.7 F (36.5 C)-99.6 F (37.6 C)] 98.5 F (36.9 C) (06/08 0533) Pulse Rate:  [61-88] 63 (06/08 0533) Resp:  [12-18] 15 (06/08 0533) BP: (79-102)/(52-60) 99/57 mmHg (06/08 0533) SpO2:  [94 %-100 %] 100 % (06/08 0533) Weight:  [118 lb (53.524 kg)] 118 lb (53.524 kg) (06/07 1115) Last BM Date: 05/21/15    EXAM General: awake, alert and no distress Resp: clear to auscultation bilaterally Cardio: regular rate and rhythm GI: soft, non tender, bowel sounds active Lower Extremities: Without swelling or tenderness Vaginal Bleeding: Vag packing removed with scant bleeding   Lab Results:  No results for input(s): WBC, HGB, HCT, PLT in the last 72 hours.  Assessment: s/p Procedure(s): HYSTERECTOMY VAGINAL BILATERAL SALPINGECTOMY ANTERIOR (CYSTOCELE) AND POSTERIOR REPAIR (RECTOCELE): progressing well, ready for discharge.    Plan: Discharge home today after foley out and voiding.  I again reviewed precautions, instructions and follow up.  Continue Lovenox 40 mg daily for one week.  Prescriptions provided per AVS.  Patient to call the office to arrange a post-operative appointmant in 2 weeks.    Anastasio Auerbach MD, 7:11 AM 05/23/2015

## 2015-05-23 NOTE — Discharge Instructions (Signed)
°  Postoperative Instructions Hysterectomy ° °Dr. Ashtian Villacis and the nursing staff have discussed postoperative instructions with you.  If you have any questions please ask them before you leave the hospital, or call Dr Timica Marcom’s office at [phone].   ° °We would like to emphasize the following instructions: ° ° °  Call the office to make your follow-up appointment as recommended by Dr Steven Veazie (usually 2 weeks). ° °  You were given a prescription, or one was ordered for you at the pharmacy you designated.  Get that prescription filled and take the medication according to instructions. ° °  You may eat a regular diet, but slowly until you start having bowel movements. ° °  Drink plenty of water daily. ° °  Nothing in the vagina (intercourse, douching, objects of any kind) until released by Dr Dejohn Ibarra. ° °  No driving for two weeks.  Wait to be cleared by Dr Fartun Paradiso at your first post op check.  Car rides (short) are ok after several days at home, as long as you are not having significant pain, but no traveling out of town. ° °  You may shower, but no baths.  Walking up and down stairs is ok.  No heavy lifting, prolonged standing, repeated bending or any “working out” until your first post op check. ° °  Rest frequently, listen to your body and do not push yourself and overdo it. ° °  Call if: ° °o Your pain medication does not seem strong enough. °o Worsening pain or abdominal bloating °o Persistent nausea or vomiting °o Difficulty with urination or bowel movements. °o Temperature of 101 degrees or higher. °o Bleeding heavier then staining (clots or period type flow). °o Incisions become red, tender or begin to drain. °o You have any questions or concerns. °

## 2015-05-23 NOTE — Progress Notes (Signed)
Lovenox education booklet, and educational sheet given to patient. RN instructed patient and patient's husband on how to administer Lovenox. RN educated patient and patient's husband on side effects of lovenox and signs and symptoms to look for, and importance of rotating site. Patient and patient's husband demonstrated and voiced understanding.

## 2015-05-23 NOTE — Progress Notes (Signed)
Pt discharged home with husband... Discharge instructions reviewed with pt and she verbalized understanding... Condition stable... No equipment... Ambulated to car with Astrid Divine, NT.

## 2015-05-24 NOTE — Discharge Summary (Signed)
Colleen Gardner 11-29-1966 [id number]   Discharge Summary  Date of Admission:  05/22/2015  Date of Discharge:  05/23/2015  Discharge Diagnosis:  Uterine prolapse, cystocele, rectocele  Procedure:  Procedure(s): HYSTERECTOMY VAGINAL BILATERAL SALPINGECTOMY ANTERIOR (CYSTOCELE) AND POSTERIOR REPAIR (RECTOCELE)  Pathology:  Uterus and bilateral fallopian tubes, with cervix - SECRETORY PHASE ENDOMETRIUM WITH UNDERLYING MYOMETRIUM DEMONSTRATING ADENOMYOSIS AND LEIOMYOMA FORMATION. - BENIGN CERVIX. - BENIGN BILATERAL FALLOPIAN TUBES. - NO DYSPLASIA, ATYPIA, HYPERPLASIA, OR MALIGNANCY IDENTIFIED.  Hospital Course:  The patient underwent an uncomplicated TVH, bilateral salpingectomy, anterior and posterior colporrhaphy 05/22/2015. Her postoperative course was uncomplicated and she was discharged on postoperative day #1 after removal of her vaginal packing and Foley catheter voiding without difficulty, tolerating a regular diet with good pain relief on oral medication. The patient received instructions for postoperative care and call precautions.  She received prescriptions per AVS and will be seen in the office 2 weeks following discharge.       Anastasio Auerbach MD, 8:12 AM 05/24/2015

## 2015-05-27 ENCOUNTER — Telehealth: Payer: Self-pay | Admitting: Gynecology

## 2015-05-27 NOTE — Telephone Encounter (Signed)
On Call Note:  C/O large bowel movement today causing pain requiring percocet.  Patient afraid she did something to the repair.  Had normal sized BM Friday without difficulty.Otherwise doing well not using pain med's before, eating, voiding, no temps, scant bleeding.  Recommend stool softeners, fluids, continue pain med's as needed.  OV in AM if continued pain/concern.  Call back precautions for today reviewed.

## 2015-06-01 ENCOUNTER — Telehealth: Payer: Self-pay | Admitting: *Deleted

## 2015-06-01 NOTE — Telephone Encounter (Signed)
Pt called c/o slight vaginal itch post Vag hyst. On 05/22/15 has 1 diflucan tablet will take this and no better by Monday will make OV.

## 2015-06-07 ENCOUNTER — Encounter: Payer: Self-pay | Admitting: Gynecology

## 2015-06-07 ENCOUNTER — Ambulatory Visit (INDEPENDENT_AMBULATORY_CARE_PROVIDER_SITE_OTHER): Payer: 59 | Admitting: Gynecology

## 2015-06-07 VITALS — BP 110/60

## 2015-06-07 DIAGNOSIS — Z9889 Other specified postprocedural states: Secondary | ICD-10-CM

## 2015-06-07 MED ORDER — FLUCONAZOLE 150 MG PO TABS: 150.0000 mg | ORAL_TABLET | Freq: Once | ORAL | Status: DC

## 2015-06-07 NOTE — Progress Notes (Signed)
Colleen Gardner 1966-08-06 [id number]        49 y.o.  [id number] Presents for her first postoperative visit 2 weeks postop status post TVH anterior-posterior colporrhaphy with bilateral salpingectomy.  Having difficulty with bowel movements going days without bowel movements and her last one she had to bear down and strained and she was afraid that she tore out some sutures. She had noticed some bleeding afterwards. Otherwise doing well voiding without difficulty with minimal pain overall.  Past medical history,surgical history, problem list, medications, allergies, family history and social history were all reviewed and documented in the EPIC chart.  Directed ROS with pertinent positives and negatives documented in the history of present illness/assessment and plan.  Exam: Kim assistant Filed Vitals:   06/07/15 0928  BP: 110/60   General appearance:  Normal Abdomen: Soft nontender without masses guarding rebound Pelvic: External BUS vagina with intact suture lines. Good anterior posterior support. Bimanual without masses or significant tenderness.  Assessment/Plan:  49 y.o. [id number] with normal postoperative exam status post TVH anterior-posterior colporrhaphy. Patient will slowly resume activities with the exception of pelvic rest. Encourage stool softeners and to use a laxative if she does not have a bowel movement every 2 days. Sitz baths for discomfort along the external suture line. Follow up in 2 weeks for next postoperative visit.    Anastasio Auerbach MD, 9:41 AM 06/07/2015

## 2015-06-07 NOTE — Patient Instructions (Signed)
Use sitz bath's several times daily to help with the soreness. Follow up in 2 weeks for next postoperative visit.

## 2015-06-13 ENCOUNTER — Encounter: Payer: Self-pay | Admitting: Gynecology

## 2015-06-13 ENCOUNTER — Ambulatory Visit (INDEPENDENT_AMBULATORY_CARE_PROVIDER_SITE_OTHER): Payer: 59 | Admitting: Gynecology

## 2015-06-13 ENCOUNTER — Telehealth: Payer: Self-pay | Admitting: *Deleted

## 2015-06-13 VITALS — BP 116/74

## 2015-06-13 DIAGNOSIS — Z9889 Other specified postprocedural states: Secondary | ICD-10-CM

## 2015-06-13 MED ORDER — METRONIDAZOLE 500 MG PO TABS: 500.0000 mg | ORAL_TABLET | Freq: Two times a day (BID) | ORAL | Status: DC

## 2015-06-13 NOTE — Telephone Encounter (Signed)
Work in this morning

## 2015-06-13 NOTE — Patient Instructions (Signed)
Take the Flagyl medication twice daily for 7 days. Follow up in one week at your scheduled postoperative appointment. Call sooner if any issues.

## 2015-06-13 NOTE — Telephone Encounter (Signed)
Pt post TVH anterior-posterior colporrhaphy with bilateral salpingectomy on 06/07/15. Pt called today stating she is still bleeding not heavy, medium flow, changing pads every 4 hours, states vaginal odor is worse, sutures still sore. Do you want pt to be worked in? Please advise

## 2015-06-13 NOTE — Progress Notes (Signed)
Colleen Gardner Jan 16, 1966 [id number]        49 y.o.  [id number] Presents having called with persistent vaginal bleeding. Patient notes continued bleeding since surgery, not heavy but requiring a pad and changing about every 4 hours or so. Also noting the stitches at the posterior part of the vagina are irritating her. Otherwise doing well with regular bowel movements and voiding well without difficulty. Having minimal discomfort overall without fever chills or other issues..  Past medical history,surgical history, problem list, medications, allergies, family history and social history were all reviewed and documented in the EPIC chart.  Directed ROS with pertinent positives and negatives documented in the history of present illness/assessment and plan.  Exam: Kim assistant Filed Vitals:   06/13/15 1110  BP: 116/74   General appearance:  Normal Abdomen soft nontender without masses guarding rebound. Pelvic external BUS vagina with suture lines intact to include the cuff, anterior and posterior vaginal suture lines. Old dark blood noted in the vagina but no specific area of oozing or bleeding. Posterior suture line along "episiotomy line" grossly normal with some sutures coming loose spontaneously but other interrupted sutures intact at the posterior fourchette. The sutures were removed as I think this is what's pulling and causing discomfort and being rubbed by the pad. Skin has remained intact.  Assessment/Plan:  49 y.o. [id number]  With persistent bleeding postoperative.  I suspect this may be contributed to by her week use of Lovenox and possible small cuff hematoma that is now draining. I did not feel any abnormalities and she is having no discomfort. Will monitor at present and she will follow up in one week at her scheduled postop appointment. I removed several sutures which I think were pulling and causing her discomfort and will see if this doesn't help with that also. She does note some odor and a  gone ahead and started her on Flagyl 500 mg twice a day 7 days as a preventative for bacterial overgrowth vaginally. She did list an allergy to Flagyl but on questioning it was a headache and she'll monitor for this and if she does have that she'll call.    Anastasio Auerbach MD, 11:28 AM 06/13/2015

## 2015-06-13 NOTE — Telephone Encounter (Signed)
Front desk will call.

## 2015-06-19 ENCOUNTER — Ambulatory Visit (INDEPENDENT_AMBULATORY_CARE_PROVIDER_SITE_OTHER): Payer: 59 | Admitting: Gynecology

## 2015-06-19 ENCOUNTER — Encounter: Payer: Self-pay | Admitting: Gynecology

## 2015-06-19 ENCOUNTER — Encounter: Payer: Self-pay | Admitting: *Deleted

## 2015-06-19 VITALS — BP 110/70

## 2015-06-19 DIAGNOSIS — Z9889 Other specified postprocedural states: Secondary | ICD-10-CM

## 2015-06-19 NOTE — Patient Instructions (Signed)
Follow up in 2 weeks for your next postoperative visit. Continue with nothing in the vagina. Avoid heavy lifting.

## 2015-06-19 NOTE — Progress Notes (Signed)
Colleen Gardner 20-Feb-1966 [id number]        49 y.o.  [id number] Presents one month postoperative status post TVH, anterior/posterior colporrhaphy, bilateral salpingectomy. Doing much better than the last time I saw her since removing the posterior sutures she's no longer having discomfort. Also note she is no longer bleeding.  Past medical history,surgical history, problem list, medications, allergies, family history and social history were all reviewed and documented in the EPIC chart.  Directed ROS with pertinent positives and negatives documented in the history of present illness/assessment and plan.  Exam: Kim assistant Filed Vitals:   06/19/15 1414  BP: 110/70   General appearance:  Normal External BUS vagina with cuff intact and anterior/posterior suture lines intact. No masses or tenderness on bimanual.  Assessment/Plan:  49 y.o. G2P2002 status post TVH, anterior/posterior colporrhaphy and salpingectomies doing well. Slowly resume activities with the exception of pelvic rest and no heavy straining or lifting. Working from home 4 hours a day. We'll extend this through her next postoperative visit in 2 weeks.    Anastasio Auerbach MD, 2:29 PM 06/19/2015

## 2015-06-19 NOTE — Progress Notes (Signed)
Pt was seen today for a post op visit. Dr Phineas Real has extended her work from home for 4 hours a day until her next post op appoitnment which is on 06/29/15. Form faxed to [phone] Attn Felipe Drone.  Sharrie Rothman CMA

## 2015-06-21 ENCOUNTER — Ambulatory Visit: Payer: 59 | Admitting: Gynecology

## 2015-06-29 ENCOUNTER — Encounter: Payer: Self-pay | Admitting: Gynecology

## 2015-06-29 ENCOUNTER — Ambulatory Visit (INDEPENDENT_AMBULATORY_CARE_PROVIDER_SITE_OTHER): Payer: 59 | Admitting: Gynecology

## 2015-06-29 VITALS — BP 114/66

## 2015-06-29 DIAGNOSIS — Z9889 Other specified postprocedural states: Secondary | ICD-10-CM

## 2015-06-29 NOTE — Progress Notes (Signed)
Colleen Gardner 1966-12-06 [id number]        49 y.o.  [id number] Presents for her postoperative visit status post TVH anterior-posterior repair and bilateral salpingectomy. Doing well without issues.  Past medical history,surgical history, problem list, medications, allergies, family history and social history were all reviewed and documented in the EPIC chart.  Directed ROS with pertinent positives and negatives documented in the history of present illness/assessment and plan.  Exam: Kim assistant Filed Vitals:   06/29/15 0801  BP: 114/66   General appearance:  Normal Abdomen soft nontender without masses guarding rebound Pelvic external BUS vagina with suture lines healing. Bimanual without masses or tenderness  Assessment/Plan:  49 y.o. [id number] with normal postoperative checkup. She will slowly resume all normal activities with the exception of continued pelvic rest. Asked her to avoid very strenuous straining.  Follow up in 2 weeks for next postoperative visit.    Anastasio Auerbach MD, 8:14 AM 06/29/2015

## 2015-06-29 NOTE — Patient Instructions (Signed)
Follow-up in 2 weeks for your next postoperative visit. 

## 2015-07-13 ENCOUNTER — Ambulatory Visit (INDEPENDENT_AMBULATORY_CARE_PROVIDER_SITE_OTHER): Payer: 59 | Admitting: Gynecology

## 2015-07-13 ENCOUNTER — Encounter: Payer: Self-pay | Admitting: Gynecology

## 2015-07-13 VITALS — BP 110/70

## 2015-07-13 DIAGNOSIS — Z9889 Other specified postprocedural states: Secondary | ICD-10-CM

## 2015-07-13 NOTE — Progress Notes (Signed)
Colleen Gardner 1966-03-26 [id number]        49 y.o.  [id number] presents for final postoperative visit  Status post TVH anterior and posterior colporrhaphy bilateral salpingectomy. Doing well without complaints.  Past medical history,surgical history, problem list, medications, allergies, family history and social history were all reviewed and documented in the EPIC chart.  Directed ROS with pertinent positives and negatives documented in the history of present illness/assessment and plan.  Exam: Kim assistant Filed Vitals:   07/13/15 1457  BP: 110/70   General appearance:  Normal Abdomen soft nontender without masses guarding rebound Pelvic external BUS vagina with incision sites all healed. Bimanual without masses or tenderness  Assessment/Plan:  49 y.o. [id number] with normal postoperative visit with incision sites well-healed. Patient will slowly resume normal activities to include intercourse. The need to start gently was stressed. Assuming she does well then follow up in March 2017 when she is due for her annual exam, sooner as needed.    Anastasio Auerbach MD, 3:12 PM 07/13/2015

## 2015-07-13 NOTE — Patient Instructions (Signed)
Slowly resume all normal activities. Call if you have any issues. Follow up in March 2017 for annual exam

## 2015-09-21 ENCOUNTER — Ambulatory Visit (INDEPENDENT_AMBULATORY_CARE_PROVIDER_SITE_OTHER): Payer: 59 | Admitting: Women's Health

## 2015-09-21 ENCOUNTER — Encounter: Payer: Self-pay | Admitting: Women's Health

## 2015-09-21 DIAGNOSIS — B373 Candidiasis of vulva and vagina: Secondary | ICD-10-CM | POA: Diagnosis not present

## 2015-09-21 DIAGNOSIS — B3731 Acute candidiasis of vulva and vagina: Secondary | ICD-10-CM | POA: Insufficient documentation

## 2015-09-21 LAB — WET PREP FOR TRICH, YEAST, CLUE
Clue Cells Wet Prep HPF POC: NONE SEEN
TRICH WET PREP: NONE SEEN
WBC, Wet Prep HPF POC: NONE SEEN
YEAST WET PREP: NONE SEEN

## 2015-09-21 MED ORDER — FLUCONAZOLE 150 MG PO TABS: 150.0000 mg | ORAL_TABLET | Freq: Once | ORAL | Status: DC

## 2015-09-21 NOTE — Progress Notes (Signed)
Patient ID: Colleen Gardner, female   DOB: [birth year], 49 y.o.   MRN: [id number] Presents with complaint of increased discharge with itching without odor used one Diflucan with some relief. States has frequent yeast infections. Also has reports mild discomfort at introitus, no dyspareunia. TVH for prolapse 4 months ago. Denies abdominal pain, urinary symptoms, fever.  Exam: Appears well. External genitalia mild erythema at introitus, no visible ulceration, lesions. Speculum exam moderate amount of a white discharge noted wet prep negative for yeast or BV. Bimanual no adnexal tenderness minimal tenderness with exam at introitus.  Clinical yeast  Plan: Diflucan 150 by mouth times one dose with refill. Yeast prevention discussed call if no relief. Boric acid 600 mg PER vagina twice weekly after symptoms resolve.

## 2015-09-21 NOTE — Patient Instructions (Signed)

## 2015-09-21 NOTE — Addendum Note (Signed)
Addended by: Thamas Jaegers on: 09/21/2015 02:15 PM   Modules accepted: Orders

## 2015-10-15 ENCOUNTER — Encounter: Payer: Self-pay | Admitting: Gynecology

## 2015-10-15 ENCOUNTER — Ambulatory Visit (INDEPENDENT_AMBULATORY_CARE_PROVIDER_SITE_OTHER): Payer: 59 | Admitting: Gynecology

## 2015-10-15 VITALS — BP 114/70

## 2015-10-15 DIAGNOSIS — B3731 Acute candidiasis of vulva and vagina: Secondary | ICD-10-CM

## 2015-10-15 DIAGNOSIS — B373 Candidiasis of vulva and vagina: Secondary | ICD-10-CM | POA: Diagnosis not present

## 2015-10-15 LAB — WET PREP FOR TRICH, YEAST, CLUE: Trich, Wet Prep: NONE SEEN

## 2015-10-15 MED ORDER — TERCONAZOLE 0.4 % VA CREA: 1.0000 | TOPICAL_CREAM | Freq: Every day | VAGINAL | Status: DC

## 2015-10-15 NOTE — Addendum Note (Signed)
Addended by: Nelva Nay on: 10/15/2015 11:59 AM   Modules accepted: Orders

## 2015-10-15 NOTE — Patient Instructions (Signed)
Use the vaginal cream nightly for 7 nights.  Follow-up if your symptoms persist, worsen or recur. 

## 2015-10-15 NOTE — Progress Notes (Signed)
Colleen Gardner 1966-11-10 [id number]        49 y.o.  [id number] Presents with vaginal itching and discharge.  Was recently seen by Izora Gala 09/21/2015 for similar complaints and treated with Diflucan 1 dose. Symptoms resolved. She was given forecasted suppositories for prophylactic once a week use. Used 2 of these and found the vaginal irritation was too much and she stopped using them. Did well until several days ago with the return of vaginal itching and discharge.  Past medical history,surgical history, problem list, medications, allergies, family history and social history were all reviewed and documented in the EPIC chart.  Directed ROS with pertinent positives and negatives documented in the history of present illness/assessment and plan.  Exam: Kim assistant Filed Vitals:   10/15/15 1109  BP: 114/70   General appearance:  Normal External BUS vagina with thick white discharge. Bimanual without masses or tenderness.  Assessment/Plan:  49 y.o. G2P2002 history, exam and wet prep consistent with yeast vaginitis. Treat with Terazol 7 day cream nightly 7 nights. Follow up if symptoms persist, worsen or recur. Follow up February/March 2017 for annual exam when due.    Anastasio Auerbach MD, 11:28 AM 10/15/2015

## 2015-11-22 ENCOUNTER — Telehealth: Payer: Self-pay | Admitting: *Deleted

## 2015-11-22 MED ORDER — FLUCONAZOLE 200 MG PO TABS: 200.0000 mg | ORAL_TABLET | Freq: Every day | ORAL | Status: DC

## 2015-11-22 NOTE — Telephone Encounter (Signed)
Diflucan 200 mg daily 3 days

## 2015-11-22 NOTE — Telephone Encounter (Signed)
Pt aware Rx sent.  

## 2015-11-22 NOTE — Telephone Encounter (Signed)
Pt called c/o recurrent yeast infection itching, white discharge, lots of irritation. Pt prefer pills verse cream, but is fine with whatever you think is best. Please advise

## 2015-12-04 ENCOUNTER — Encounter: Payer: Self-pay | Admitting: Family Medicine

## 2015-12-04 ENCOUNTER — Ambulatory Visit (INDEPENDENT_AMBULATORY_CARE_PROVIDER_SITE_OTHER): Payer: 59 | Admitting: Family Medicine

## 2015-12-04 VITALS — BP 123/79 | HR 57 | Ht 65.0 in | Wt 130.0 lb

## 2015-12-04 DIAGNOSIS — M79674 Pain in right toe(s): Secondary | ICD-10-CM

## 2015-12-04 MED ORDER — METHYLPREDNISOLONE ACETATE 40 MG/ML IJ SUSP
40.0000 mg | Freq: Once | INTRAMUSCULAR | Status: AC
  Administered 2015-12-04: 20 mg via INTRA_ARTICULAR

## 2015-12-04 NOTE — Patient Instructions (Signed)
Your toe pain (at the 1st MTP joint) is due to arthritis though you also have a small piece of soft tissue that is getting pinched in the joint. These are relatively difficult to treat. There are 4 classes of medicine you can use for this: Tylenol 500mg  1-2 tabs three times a day for pain. Aleve 1-2 tabs twice a day with food Glucosamine sulfate 750mg  twice a day is a supplement that may help. Capsaicin, aspercreme, or biofreeze topically up to four times a day may also help with pain. Cortisone injections are an option. Buddy taping, inserts with a 1st ray post, hard soled shoes can help rest this. Physical therapy is unlikely to be helpful for this. Heat or ice 15 minutes at a time 3-4 times a day as needed to help with pain.

## 2015-12-05 DIAGNOSIS — M19079 Primary osteoarthritis, unspecified ankle and foot: Secondary | ICD-10-CM | POA: Insufficient documentation

## 2015-12-05 DIAGNOSIS — M79676 Pain in unspecified toe(s): Secondary | ICD-10-CM | POA: Insufficient documentation

## 2015-12-05 NOTE — Assessment & Plan Note (Signed)
Consistent with DJD - spurring seen dorsally on ultrasound.  But she also has palpable plica that pops away from joint on full dorsiflexion (states this is not painful though).  Discussed tylenol, nsaids, glucosamine, topical medications.  Buddy taping, hard soled shoe or inserts with first ray post discussed (declined for now).  Opted for an injection into 1st MTP.  Heat or ice as needed.  F/u in 6 weeks or prn.  After informed written consent patient (risks bleeding, infection, skin discoloration) was seated in chair of exam room.  Area overlying medial aspect 1st MTP identified with ultrasound then prepped with alcohol swab and injected with 0.5: 0.60mL marcaine: depomedrol.  Patient tolerated procedure well without immediate complications.

## 2015-12-05 NOTE — Progress Notes (Signed)
PCP: Anastasio Auerbach, MD  Subjective:   HPI: Patient is a 49 y.o. female here for right great toe pain.  Patient reports no known injury or trauma. She's had problems with great toe for about 10 years though worse past 3 months. Saw ortho previously - given nsaids but didn't improve. Pain worse barefoot walking and after on feet a lot. Better with tennis shoes. Pain level 0/10 at rest, 5/10 with walking, 9/10 barefoot, sharp pain. Some slight swelling. No skin changes, fever, other complaints. No history of gout.  Past Medical History  Diagnosis Date  . Factor V Leiden mutation [facility])     Current Outpatient Prescriptions on File Prior to Visit  Medication Sig Dispense Refill  . Ibuprofen (ADVIL PO) Take by mouth.    . terconazole (TERAZOL 7) 0.4 % vaginal cream Place 1 applicator vaginally at bedtime. For 7 nights 45 g 0   No current facility-administered medications on file prior to visit.    Past Surgical History  Procedure Laterality Date  . Tonsillectomy and sinus surgery  2006  . Hysteroscopy  04/2005    HYSTEROSCOPIC MYOMECTOMY  . Iud removal  08/2009    side effect symptoms after 2 months placement  . Vaginal hysterectomy N/A 05/22/2015    Procedure: HYSTERECTOMY VAGINAL;  Surgeon: Anastasio Auerbach, MD;  Location: [facility];  Service: Gynecology;  Laterality: N/A; FOR UTERINE PROLAPSE  . Bilateral salpingectomy Bilateral 05/22/2015    Procedure: BILATERAL SALPINGECTOMY;  Surgeon: Anastasio Auerbach, MD;  Location: [facility];  Service: Gynecology;  Laterality: Bilateral;  . Anterior and posterior repair N/A 05/22/2015    Procedure: ANTERIOR (CYSTOCELE) AND POSTERIOR REPAIR (RECTOCELE);  Surgeon: Anastasio Auerbach, MD;  Location: [facility];  Service: Gynecology;  Laterality: N/A;    Allergies  Allergen Reactions  . Clindamycin/Lincomycin Diarrhea  . Lactose Intolerance (Gi)     Social History   Social History  . Marital Status: Married    Spouse Name: N/A  . Number  of Children: N/A  . Years of Education: N/A   Occupational History  . Not on file.   Social History Main Topics  . Smoking status: Never Smoker   . Smokeless tobacco: Not on file  . Alcohol Use: 1.8 oz/week    3 Standard drinks or equivalent per week  . Drug Use: No  . Sexual Activity: Yes    Birth Control/ Protection: Other-see comments     Comment: Vasectomy-1st intercourse 49 yo--Fewer than 5 partners   Other Topics Concern  . Not on file   Social History Narrative    No family history on file.  BP 123/79 mmHg  Pulse 57  Ht 5\' 5"  (1.651 m)  Wt 130 lb (58.968 kg)  BMI 21.63 kg/m2  LMP 04/20/2015  Review of Systems: See HPI above.    Objective:  Physical Exam:  Gen: NAD  Right foot: No hallux valgus.  Mild hallux rigidus.  No other abnormalities, bruising, deformity. TTP 1st MTP dorsally.  No other tenderness. FROM ankle, digits though mild limitation dorsiflexion - palpable click on full extension of 1st MTP with soft tissue felt moving dorsolaterally. NVI distally.  Left foot/ankle: FROM without pain.    Assessment & Plan:  1. Right great toe pain - Consistent with DJD - spurring seen dorsally on ultrasound.  But she also has palpable plica that pops away from joint on full dorsiflexion (states this is not painful though).  Discussed tylenol, nsaids, glucosamine, topical medications.  Buddy taping,  hard soled shoe or inserts with first ray post discussed (declined for now).  Opted for an injection into 1st MTP.  Heat or ice as needed.  F/u in 6 weeks or prn.  After informed written consent patient (risks bleeding, infection, skin discoloration) was seated in chair of exam room.  Area overlying medial aspect 1st MTP identified with ultrasound then prepped with alcohol swab and injected with 0.5: 0.77mL marcaine: depomedrol.  Patient tolerated procedure well without immediate complications.

## 2016-04-25 ENCOUNTER — Encounter: Payer: 59 | Admitting: Gynecology

## 2016-04-29 ENCOUNTER — Encounter: Payer: Self-pay | Admitting: Gynecology

## 2016-04-29 ENCOUNTER — Ambulatory Visit (INDEPENDENT_AMBULATORY_CARE_PROVIDER_SITE_OTHER): Payer: 59 | Admitting: Gynecology

## 2016-04-29 VITALS — BP 112/76 | Ht 65.0 in | Wt 119.0 lb

## 2016-04-29 DIAGNOSIS — N951 Menopausal and female climacteric states: Secondary | ICD-10-CM

## 2016-04-29 DIAGNOSIS — Z01419 Encounter for gynecological examination (general) (routine) without abnormal findings: Secondary | ICD-10-CM

## 2016-04-29 NOTE — Patient Instructions (Addendum)
Schedule your mammogram  You may obtain a copy of any labs that were done today by logging onto MyChart as outlined in the instructions provided with your AVS (after visit summary). The office will not call with normal lab results but certainly if there are any significant abnormalities then we will contact you.   Health Maintenance Adopting a healthy lifestyle and getting preventive care can go a long way to promote health and wellness. Talk with your health care provider about what schedule of regular examinations is right for you. This is a good chance for you to check in with your provider about disease prevention and staying healthy. In between checkups, there are plenty of things you can do on your own. Experts have done a lot of research about which lifestyle changes and preventive measures are most likely to keep you healthy. Ask your health care provider for more information. WEIGHT AND DIET  Eat a healthy diet  Be sure to include plenty of vegetables, fruits, low-fat dairy products, and lean protein.  Do not eat a lot of foods high in solid fats, added sugars, or salt.  Get regular exercise. This is one of the most important things you can do for your health.  Most adults should exercise for at least 150 minutes each week. The exercise should increase your heart rate and make you sweat (moderate-intensity exercise).  Most adults should also do strengthening exercises at least twice a week. This is in addition to the moderate-intensity exercise.  Maintain a healthy weight  Body mass index (BMI) is a measurement that can be used to identify possible weight problems. It estimates body fat based on height and weight. Your health care provider can help determine your BMI and help you achieve or maintain a healthy weight.  For females 20 years of age and older:   A BMI below 18.5 is considered underweight.  A BMI of 18.5 to 24.9 is normal.  A BMI of 25 to 29.9 is considered  overweight.  A BMI of 30 and above is considered obese.  Watch levels of cholesterol and blood lipids  You should start having your blood tested for lipids and cholesterol at 48 years of age, then have this test every 5 years.  You may need to have your cholesterol levels checked more often if:  Your lipid or cholesterol levels are high.  You are older than 48 years of age.  You are at high risk for heart disease.  CANCER SCREENING   Lung Cancer  Lung cancer screening is recommended for adults 55-80 years old who are at high risk for lung cancer because of a history of smoking.  A yearly low-dose CT scan of the lungs is recommended for people who:  Currently smoke.  Have quit within the past 15 years.  Have at least a 30-pack-year history of smoking. A pack year is smoking an average of one pack of cigarettes a day for 1 year.  Yearly screening should continue until it has been 15 years since you quit.  Yearly screening should stop if you develop a health problem that would prevent you from having lung cancer treatment.  Breast Cancer  Practice breast self-awareness. This means understanding how your breasts normally appear and feel.  It also means doing regular breast self-exams. Let your health care provider know about any changes, no matter how small.  If you are in your 20s or 30s, you should have a clinical breast exam (CBE) by a health   care provider every 1-3 years as part of a regular health exam.  If you are 76 or older, have a CBE every year. Also consider having a breast X-ray (mammogram) every year.  If you have a family history of breast cancer, talk to your health care provider about genetic screening.  If you are at high risk for breast cancer, talk to your health care provider about having an MRI and a mammogram every year.  Breast cancer gene (BRCA) assessment is recommended for women who have family members with BRCA-related cancers. BRCA-related  cancers include:  Breast.  Ovarian.  Tubal.  Peritoneal cancers.  Results of the assessment will determine the need for genetic counseling and BRCA1 and BRCA2 testing. Cervical Cancer Routine pelvic examinations to screen for cervical cancer are no longer recommended for nonpregnant women who are considered low risk for cancer of the pelvic organs (ovaries, uterus, and vagina) and who do not have symptoms. A pelvic examination may be necessary if you have symptoms including those associated with pelvic infections. Ask your health care provider if a screening pelvic exam is right for you.   The Pap test is the screening test for cervical cancer for women who are considered at risk.  If you had a hysterectomy for a problem that was not cancer or a condition that could lead to cancer, then you no longer need Pap tests.  If you are older than 65 years, and you have had normal Pap tests for the past 10 years, you no longer need to have Pap tests.  If you have had past treatment for cervical cancer or a condition that could lead to cancer, you need Pap tests and screening for cancer for at least 20 years after your treatment.  If you no longer get a Pap test, assess your risk factors if they change (such as having a new sexual partner). This can affect whether you should start being screened again.  Some women have medical problems that increase their chance of getting cervical cancer. If this is the case for you, your health care provider may recommend more frequent screening and Pap tests.  The human papillomavirus (HPV) test is another test that may be used for cervical cancer screening. The HPV test looks for the virus that can cause cell changes in the cervix. The cells collected during the Pap test can be tested for HPV.  The HPV test can be used to screen women 64 years of age and older. Getting tested for HPV can extend the interval between normal Pap tests from three to five  years.  An HPV test also should be used to screen women of any age who have unclear Pap test results.  After 48 years of age, women should have HPV testing as often as Pap tests.  Colorectal Cancer  This type of cancer can be detected and often prevented.  Routine colorectal cancer screening usually begins at 48 years of age and continues through 48 years of age.  Your health care provider may recommend screening at an earlier age if you have risk factors for colon cancer.  Your health care provider may also recommend using home test kits to check for hidden blood in the stool.  A small camera at the end of a tube can be used to examine your colon directly (sigmoidoscopy or colonoscopy). This is done to check for the earliest forms of colorectal cancer.  Routine screening usually begins at age 14.  Direct examination of the  colon should be repeated every 5-10 years through 48 years of age. However, you may need to be screened more often if early forms of precancerous polyps or small growths are found. Skin Cancer  Check your skin from head to toe regularly.  Tell your health care provider about any new moles or changes in moles, especially if there is a change in a mole's shape or color.  Also tell your health care provider if you have a mole that is larger than the size of a pencil eraser.  Always use sunscreen. Apply sunscreen liberally and repeatedly throughout the day.  Protect yourself by wearing long sleeves, pants, a wide-brimmed hat, and sunglasses whenever you are outside. HEART DISEASE, DIABETES, AND HIGH BLOOD PRESSURE   Have your blood pressure checked at least every 1-2 years. High blood pressure causes heart disease and increases the risk of stroke.  If you are between 57 years and 49 years old, ask your health care provider if you should take aspirin to prevent strokes.  Have regular diabetes screenings. This involves taking a blood sample to check your fasting  blood sugar level.  If you are at a normal weight and have a low risk for diabetes, have this test once every three years after 48 years of age.  If you are overweight and have a high risk for diabetes, consider being tested at a younger age or more often. PREVENTING INFECTION  Hepatitis B  If you have a higher risk for hepatitis B, you should be screened for this virus. You are considered at high risk for hepatitis B if:  You were born in a country where hepatitis B is common. Ask your health care provider which countries are considered high risk.  Your parents were born in a high-risk country, and you have not been immunized against hepatitis B (hepatitis B vaccine).  You have HIV or AIDS.  You use needles to inject street drugs.  You live with someone who has hepatitis B.  You have had sex with someone who has hepatitis B.  You get hemodialysis treatment.  You take certain medicines for conditions, including cancer, organ transplantation, and autoimmune conditions. Hepatitis C  Blood testing is recommended for:  Everyone born from [birth year] through 1963.  Anyone with known risk factors for hepatitis C. Sexually transmitted infections (STIs)  You should be screened for sexually transmitted infections (STIs) including gonorrhea and chlamydia if:  You are sexually active and are younger than 48 years of age.  You are older than 48 years of age and your health care provider tells you that you are at risk for this type of infection.  Your sexual activity has changed since you were last screened and you are at an increased risk for chlamydia or gonorrhea. Ask your health care provider if you are at risk.  If you do not have HIV, but are at risk, it may be recommended that you take a prescription medicine daily to prevent HIV infection. This is called pre-exposure prophylaxis (PrEP). You are considered at risk if:  You are sexually active and do not regularly use condoms or know  the HIV status of your partner(s).  You take drugs by injection.  You are sexually active with a partner who has HIV. Talk with your health care provider about whether you are at high risk of being infected with HIV. If you choose to begin PrEP, you should first be tested for HIV. You should then be tested every 3 months  for as long as you are taking PrEP.  PREGNANCY   If you are premenopausal and you may become pregnant, ask your health care provider about preconception counseling.  If you may become pregnant, take 400 to 800 micrograms (mcg) of folic acid every day.  If you want to prevent pregnancy, talk to your health care provider about birth control (contraception). OSTEOPOROSIS AND MENOPAUSE   Osteoporosis is a disease in which the bones lose minerals and strength with aging. This can result in serious bone fractures. Your risk for osteoporosis can be identified using a bone density scan.  If you are 41 years of age or older, or if you are at risk for osteoporosis and fractures, ask your health care provider if you should be screened.  Ask your health care provider whether you should take a calcium or vitamin D supplement to lower your risk for osteoporosis.  Menopause may have certain physical symptoms and risks.  Hormone replacement therapy may reduce some of these symptoms and risks. Talk to your health care provider about whether hormone replacement therapy is right for you.  HOME CARE INSTRUCTIONS   Schedule regular health, dental, and eye exams.  Stay current with your immunizations.   Do not use any tobacco products including cigarettes, chewing tobacco, or electronic cigarettes.  If you are pregnant, do not drink alcohol.  If you are breastfeeding, limit how much and how often you drink alcohol.  Limit alcohol intake to no more than 1 drink per day for nonpregnant women. One drink equals 12 ounces of beer, 5 ounces of wine, or 1 ounces of hard liquor.  Do not  use street drugs.  Do not share needles.  Ask your health care provider for help if you need support or information about quitting drugs.  Tell your health care provider if you often feel depressed.  Tell your health care provider if you have ever been abused or do not feel safe at home. Document Released: 06/16/2011 Document Revised: 04/17/2014 Document Reviewed: 11/02/2013 [facility] Patient Information 2015 [place], [place]. This information is not intended to replace advice given to you by your health care provider. Make sure you discuss any questions you have with your health care provider.

## 2016-04-29 NOTE — Progress Notes (Signed)
    Colleen Gardner Apr 19, 1966 [name]        50 y.o.  G2P2002  for annual exam.  Several issues noted below.  Past medical history,surgical history, problem list, medications, allergies, family history and social history were all reviewed and documented as reviewed in the EPIC chart.  ROS:  Performed with pertinent positives and negatives included in the history, assessment and plan.   Additional significant findings :  none   Exam: Colleen Gardner assistant Filed Vitals:   04/29/16 1517  BP: 112/76  Height: 5\' 5"  (1.651 m)  Weight: 119 lb (53.978 kg)   General appearance:  Normal affect, orientation and appearance. Skin: Grossly normal HEENT: Without gross lesions.  No cervical or supraclavicular adenopathy. Thyroid normal.  Lungs:  Clear without wheezing, rales or rhonchi Cardiac: RR, without RMG Abdominal:  Soft, nontender, without masses, guarding, rebound, organomegaly or hernia Breasts:  Examined lying and sitting without masses, retractions, discharge or axillary adenopathy. Pelvic:  Ext/BUS/vagina normal  Adnexa without masses or tenderness    Anus and perineum normal   Rectovaginal normal sphincter tone without palpated masses or tenderness.    Assessment/Plan:  50 y.o. [name] female for annual exam.   1. Status post TVH, A&P repair, bilateral salpingectomies 05/2015. Doing well with good support on exam. Does note the onset of hot flashes over the last several months. Tried OTC product for menopausal symptoms and seems to have gotten good relief with this. She believes it has black cohosh in it. I reviewed the whole issue of menopausal symptoms, treatment options and her history of Leiden factor V mutation. She has no personal history of thrombosis but was diagnosed when her father had a DVT and was found to be Leiden factor V positive. Relative contraindication as far as HRT was discussed but not absolute. At this point she satisfied with the OTC product. We discussed  whether OTC products increased risk of thrombosis and I discussed the does not appear they significantly increased risk but not well studied. Patient's comfortable continuing for now. 2. Pap smear/HPV 01/2015 negative. No Pap smear done today. No history of abnormal Pap smears. Options to stop screening as she is status post hysterectomy for benign indications versus less frequent screening intervals reviewed. Will readdress on an annual basis. 3. Mammography12/2015. Discussed that she is overdue and she agrees to call and schedule. SBE monthly reviewed. 4. Health maintenance. Baseline CBC, CMP and urinalysis ordered. Will also check TSH/FSH given her menopausal type symptoms.  Lipid profile last year with cholesterol 145 triglycerides 79 HDL 70 and LDL 59. Not repeated this year.  Follow up in one year, sooner as needed.   Colleen Auerbach MD, 3:58 PM Gardner

## 2016-04-30 LAB — FOLLICLE STIMULATING HORMONE: FSH: 122.2 m[IU]/mL — AB

## 2016-04-30 LAB — TSH: TSH: 2.09 m[IU]/L

## 2016-06-23 ENCOUNTER — Encounter: Payer: Self-pay | Admitting: Gynecology

## 2016-08-11 ENCOUNTER — Ambulatory Visit: Payer: 59 | Admitting: Gynecology

## 2016-10-03 ENCOUNTER — Ambulatory Visit (INDEPENDENT_AMBULATORY_CARE_PROVIDER_SITE_OTHER): Payer: 59 | Admitting: Gynecology

## 2016-10-03 ENCOUNTER — Encounter: Payer: Self-pay | Admitting: Gynecology

## 2016-10-03 VITALS — BP 114/62

## 2016-10-03 DIAGNOSIS — N898 Other specified noninflammatory disorders of vagina: Secondary | ICD-10-CM | POA: Diagnosis not present

## 2016-10-03 DIAGNOSIS — N951 Menopausal and female climacteric states: Secondary | ICD-10-CM

## 2016-10-03 LAB — WET PREP FOR TRICH, YEAST, CLUE
Clue Cells Wet Prep HPF POC: NONE SEEN
TRICH WET PREP: NONE SEEN
WBC, Wet Prep HPF POC: NONE SEEN
Yeast Wet Prep HPF POC: NONE SEEN

## 2016-10-03 MED ORDER — METRONIDAZOLE 500 MG PO TABS: 500.0000 mg | ORAL_TABLET | Freq: Two times a day (BID) | ORAL | 0 refills | Status: DC

## 2016-10-03 MED ORDER — ESTRADIOL 0.05 MG/24HR TD PTTW: 1.0000 | MEDICATED_PATCH | TRANSDERMAL | 8 refills | Status: DC

## 2016-10-03 NOTE — Patient Instructions (Signed)
Call me if you've any issues once starting the hormone replacement. Call me if the vaginal discharge continues, worsens or recurs.

## 2016-10-03 NOTE — Progress Notes (Signed)
    Colleen Gardner [birth year] [name]        50 y.o.  G2P2002 presents to discuss 2 issues:  1. Slight vaginal irritation and discharge. Noted over the past month or so odor after intercourse but that seemed to get better. Now seems to be persisting despite no intercourse with increasing vaginal irritation. No urinary symptoms such as frequency urgency dysuria low back pain fever or chills. 2. Worsening menopausal symptoms with hot flushes and sweats. No skin hair or weight changes. Lavalette elevated at 122 earlier this year with a normal TSH.  Past medical history,surgical history, problem list, medications, allergies, family history and social history were all reviewed and documented in the EPIC chart.  Directed ROS with pertinent positives and negatives documented in the history of present illness/assessment and plan.  Exam: [name] assistant Vitals:   10/03/16 0905  BP: 114/62   General appearance:  Normal Abdomen soft nontender without masses guarding rebound Pelvic external BUS vagina with white discharge. Bimanual without masses or tenderness  Assessment/Plan:  50 y.o. [name] with:  1. Vaginal discharge with odor. Wet prep is consistent with bacterial vaginosis. Does not show any yeast. Options for treatment reviewed and patient elects for Flagyl 500 mg twice a day 7 days. Alcohol avoidance reviewed. Follow up if symptoms persist, worsen or recur. 2. Menopausal symptoms. Elevated FSH. Reviewed options to include expectant, OTC products which she actually has already tried without success, pharmacologic nonhormonal such as Effexor and HRT. She is heterozygous for Leiden factor V although never had a personal history of clot she was tested based on family members DVTs. I reviewed the latest 2017 NAMS guidelines on HRT. Benefits of HRT to include symptom relief possible cardiovascular/bone health when started early area risks, particularly in her case of thrombosis such as stroke  heart attack DVT and breast cancer issue. Various routes of administration discussed. The issue of first pass effect and decreasing thrombosis risk with transdermal discussed. After a lengthy discussion in the patient having a realistic understanding of thrombosis risk she elects to start HRT with Vivelle 0.05 mg patches. Refill through next annual exam. Patient cautiously issues after initiation.  Greater than 50% of my time was spent in direct face to face counseling and coordination of care with the patient.     Anastasio Auerbach MD, 9:23 AM 10/03/2016

## 2016-10-03 NOTE — Addendum Note (Signed)
Addended by: Nelva Nay on: 10/03/2016 01:54 PM   Modules accepted: Orders

## 2016-10-09 ENCOUNTER — Telehealth: Payer: Self-pay | Admitting: *Deleted

## 2016-10-09 MED ORDER — FLUCONAZOLE 150 MG PO TABS: 150.0000 mg | ORAL_TABLET | Freq: Once | ORAL | 0 refills | Status: AC

## 2016-10-09 NOTE — Telephone Encounter (Signed)
Pt aware, Rx sent. 

## 2016-10-09 NOTE — Telephone Encounter (Signed)
Pt was prescribed Flagyl 500 mg twice a day 7 days on 10/03/16 visit, now has yeast infection vaginal itching,slight discharge. Asked pill could be prescribe? Please advise

## 2016-10-09 NOTE — Telephone Encounter (Signed)
Diflucan 150mg 1 dose

## 2016-10-10 ENCOUNTER — Encounter: Payer: Self-pay | Admitting: Family Medicine

## 2016-10-10 ENCOUNTER — Ambulatory Visit (INDEPENDENT_AMBULATORY_CARE_PROVIDER_SITE_OTHER): Payer: 59 | Admitting: Family Medicine

## 2016-10-10 VITALS — BP 151/89 | HR 76 | Ht 65.0 in | Wt 115.0 lb

## 2016-10-10 DIAGNOSIS — M79674 Pain in right toe(s): Secondary | ICD-10-CM | POA: Diagnosis not present

## 2016-10-10 MED ORDER — METHYLPREDNISOLONE ACETATE 40 MG/ML IJ SUSP
20.0000 mg | Freq: Once | INTRAMUSCULAR | Status: AC
  Administered 2016-10-10: 20 mg via INTRA_ARTICULAR

## 2016-10-10 NOTE — Patient Instructions (Signed)
Your toe pain (at the 1st MTP joint) is due to arthritis. These are relatively difficult to treat. There are 4 classes of medicine you can use for this: Tylenol 500mg  1-2 tabs three times a day for pain. Aleve 1-2 tabs twice a day with food Glucosamine sulfate 750mg  twice a day is a supplement that may help. Capsaicin, aspercreme, or biofreeze topically up to four times a day may also help with pain. Inserts with a first ray post should help if you can fit these into your shoes. Cortisone injections are an option - you were given this today. Heat or ice 15 minutes at a time 3-4 times a day as needed to help with pain.

## 2016-10-13 NOTE — Progress Notes (Signed)
PCP: Anastasio Auerbach, MD  Subjective:   HPI: Patient is a 50 y.o. female here for right great toe pain.  12/04/15: Patient reports no known injury or trauma. She's had problems with great toe for about 10 years though worse past 3 months. Saw ortho previously - given nsaids but didn't improve. Pain worse barefoot walking and after on feet a lot. Better with tennis shoes. Pain level 0/10 at rest, 5/10 with walking, 9/10 barefoot, sharp pain. Some slight swelling. No skin changes, fever, other complaints. No history of gout.  10/10/16: Patient reports she did well following last visit and cortisone injection. Over last month however started to get pain again base of right great toe. Current pain is 0/10 - worse with a lot of walking and activity, can be sharp. Pain goes across her right foot also in forefoot area. No skin changes, numbness. She does have ridges in her great toenail - has had for years.  Past Medical History:  Diagnosis Date  . Factor V Leiden mutation [facility])     Current Outpatient Prescriptions on File Prior to Visit  Medication Sig Dispense Refill  . estradiol (VIVELLE-DOT) 0.05 MG/24HR patch Place 1 patch (0.05 mg total) onto the skin 2 (two) times a week. 8 patch 8  . Ibuprofen (ADVIL PO) Take by mouth.    . metroNIDAZOLE (FLAGYL) 500 MG tablet Take 1 tablet (500 mg total) by mouth 2 (two) times daily. For 7 days.  Avoid alcohol while taking 14 tablet 0   No current facility-administered medications on file prior to visit.     Past Surgical History:  Procedure Laterality Date  . ANTERIOR AND POSTERIOR REPAIR N/A 05/22/2015   Procedure: ANTERIOR (CYSTOCELE) AND POSTERIOR REPAIR (RECTOCELE);  Surgeon: Anastasio Auerbach, MD;  Location: [facility];  Service: Gynecology;  Laterality: N/A;  . BILATERAL SALPINGECTOMY Bilateral 05/22/2015   Procedure: BILATERAL SALPINGECTOMY;  Surgeon: Anastasio Auerbach, MD;  Location: [facility];  Service: Gynecology;  Laterality:  Bilateral;  . HYSTEROSCOPY  04/2005   HYSTEROSCOPIC MYOMECTOMY  . IUD REMOVAL  08/2009   side effect symptoms after 2 months placement  . TONSILLECTOMY AND SINUS SURGERY  2006  . VAGINAL HYSTERECTOMY N/A 05/22/2015   Procedure: HYSTERECTOMY VAGINAL;  Surgeon: Anastasio Auerbach, MD;  Location: [facility];  Service: Gynecology;  Laterality: N/A; FOR UTERINE PROLAPSE    Allergies  Allergen Reactions  . Clindamycin/Lincomycin Diarrhea  . Lactose Intolerance (Gi)     Social History   Social History  . Marital status: Married    Spouse name: N/A  . Number of children: N/A  . Years of education: N/A   Occupational History  . Not on file.   Social History Main Topics  . Smoking status: Never Smoker  . Smokeless tobacco: Never Used  . Alcohol use 1.8 oz/week    3 Standard drinks or equivalent per week  . Drug use: No  . Sexual activity: Yes    Birth control/ protection: Other-see comments     Comment: Vasectomy-1st intercourse 50 yo--Fewer than 5 partners   Other Topics Concern  . Not on file   Social History Narrative  . No narrative on file    Family History  Problem Relation Age of Onset  . Cirrhosis Brother     BP (!) 151/89   Pulse 76   Ht 5\' 5"  (1.651 m)   Wt 115 lb (52.2 kg)   LMP 04/20/2015   BMI 19.14 kg/m   Review of Systems:  See HPI above.    Objective:  Physical Exam:  Gen: NAD  Right foot: No hallux valgus.  Mild hallux rigidus.  No other abnormalities, bruising, deformity. TTP 1st MTP dorsally.  No other tenderness. FROM ankle, digits except mild limitation dorsiflexion of 1st MTP. NVI distally.  Left foot/ankle: FROM without pain.    Assessment & Plan:  1. Right great toe pain - Consistent with DJD - spurring seen dorsally on ultrasound.  Again discussed tylenol, nsaids, glucosamine, topical medications.  Sports insoles provided today with 1st ray post.  Repeated 1st MTP injection as well.  Heat or ice as needed.  F/u in 6 weeks or  prn.  After informed written consent patient (risks bleeding, infection, skin discoloration) was seated in chair of exam room.  Area overlying medial aspect right 1st MTP identified with ultrasound then prepped with alcohol swab and injected with 0.5: 0.7mL marcaine: depomedrol.  Patient tolerated procedure well without immediate complications.

## 2016-10-13 NOTE — Assessment & Plan Note (Signed)
Right great toe pain - Consistent with DJD - spurring seen dorsally on ultrasound.  Again discussed tylenol, nsaids, glucosamine, topical medications.  Sports insoles provided today with 1st ray post.  Repeated 1st MTP injection as well.  Heat or ice as needed.  F/u in 6 weeks or prn.  After informed written consent patient (risks bleeding, infection, skin discoloration) was seated in chair of exam room.  Area overlying medial aspect right 1st MTP identified with ultrasound then prepped with alcohol swab and injected with 0.5: 0.75mL marcaine: depomedrol.  Patient tolerated procedure well without immediate complications.

## 2016-10-17 ENCOUNTER — Telehealth: Payer: Self-pay | Admitting: *Deleted

## 2016-10-17 MED ORDER — TERCONAZOLE 0.8 % VA CREA: 1.0000 | TOPICAL_CREAM | Freq: Every day | VAGINAL | 0 refills | Status: DC

## 2016-10-17 NOTE — Telephone Encounter (Signed)
Pt was treated for BV with flagyl 500 mg x 7 days, was given diflucan 150 x 1 dose tablet on 10/09/16, pt is still c/o external itching and irritation after taking diflucan. Pt said she has history of recurrent yeast, asked if another Rx could be prescribed? Please advise

## 2016-10-17 NOTE — Telephone Encounter (Signed)
Pt informed, Rx sent. 

## 2016-10-17 NOTE — Telephone Encounter (Signed)
Recommend Terazole 3 day cream

## 2016-11-24 ENCOUNTER — Other Ambulatory Visit: Payer: Self-pay

## 2016-11-24 MED ORDER — ESTRADIOL 0.05 MG/24HR TD PTTW: 1.0000 | MEDICATED_PATCH | TRANSDERMAL | 1 refills | Status: DC

## 2016-12-30 ENCOUNTER — Telehealth: Payer: Self-pay | Admitting: *Deleted

## 2016-12-30 MED ORDER — FLUCONAZOLE 150 MG PO TABS: ORAL_TABLET | ORAL | 0 refills | Status: DC

## 2016-12-30 NOTE — Telephone Encounter (Signed)
Pt informed with the below note. Rx sent.  

## 2016-12-30 NOTE — Telephone Encounter (Signed)
Okay for Diflucan 150 mg #3 one by mouth as needed for yeast

## 2016-12-30 NOTE — Telephone Encounter (Signed)
Pt called c/o recurrent yeast infection leaving town for vacation having vaginal itching, irritation, white discharge. Pt said you have prescribed medication before w/o OV due to recurrence. Please advise

## 2017-04-06 ENCOUNTER — Other Ambulatory Visit: Payer: Self-pay | Admitting: Gynecology

## 2017-04-06 ENCOUNTER — Telehealth: Payer: Self-pay | Admitting: *Deleted

## 2017-04-06 MED ORDER — FLUCONAZOLE 150 MG PO TABS: 150.0000 mg | ORAL_TABLET | Freq: Once | ORAL | 0 refills | Status: AC

## 2017-04-06 NOTE — Telephone Encounter (Signed)
Okay for Diflucan 150 mg 1 dose 

## 2017-04-06 NOTE — Telephone Encounter (Signed)
Pt called c/o yeast infection itching and white discharge. Pt asked if Rx could be sent to pharmacy? Please advise

## 2017-04-06 NOTE — Telephone Encounter (Signed)
Patient informed. Rx sent 

## 2017-04-09 ENCOUNTER — Telehealth: Payer: Self-pay | Admitting: *Deleted

## 2017-04-09 MED ORDER — FLUCONAZOLE 150 MG PO TABS: 150.0000 mg | ORAL_TABLET | Freq: Once | ORAL | 0 refills | Status: AC

## 2017-04-09 NOTE — Telephone Encounter (Signed)
OK 

## 2017-04-09 NOTE — Telephone Encounter (Signed)
(  TF patient) pt had a diflucan 150 #1 tablet sent to pharmacy on 04/06/17. Pt took 1 pill and still having vaginal itching, asked if 1 more Rx could be sent to pharmacy? Pt has recurrent yeast. Pt aware OV best. Please advise

## 2017-04-09 NOTE — Telephone Encounter (Signed)
Pt informed, Rx sent. Will schedule OV if no relief.

## 2017-05-07 ENCOUNTER — Telehealth: Payer: Self-pay | Admitting: *Deleted

## 2017-05-07 MED ORDER — FLUCONAZOLE 150 MG PO TABS: ORAL_TABLET | ORAL | 0 refills | Status: DC

## 2017-05-07 NOTE — Telephone Encounter (Signed)
Pt informed, Rx sent. 

## 2017-05-07 NOTE — Telephone Encounter (Signed)
Okay 

## 2017-05-07 NOTE — Telephone Encounter (Signed)
(  TF patient) pt called c/o recurrent yeast infection asked if diflucan 150 #2 tablet could be sent to pharmacy. States it typically takes 2 tablet, has annual scheduled on 05/14/17. Please advise

## 2017-05-13 ENCOUNTER — Encounter: Payer: 59 | Admitting: Gynecology

## 2017-05-14 ENCOUNTER — Ambulatory Visit (INDEPENDENT_AMBULATORY_CARE_PROVIDER_SITE_OTHER): Payer: 59 | Admitting: Gynecology

## 2017-05-14 ENCOUNTER — Encounter: Payer: Self-pay | Admitting: Gynecology

## 2017-05-14 VITALS — BP 114/70 | Ht 65.0 in | Wt 119.0 lb

## 2017-05-14 DIAGNOSIS — Z79890 Hormone replacement therapy (postmenopausal): Secondary | ICD-10-CM | POA: Diagnosis not present

## 2017-05-14 DIAGNOSIS — Z01419 Encounter for gynecological examination (general) (routine) without abnormal findings: Secondary | ICD-10-CM

## 2017-05-14 DIAGNOSIS — B3731 Acute candidiasis of vulva and vagina: Secondary | ICD-10-CM

## 2017-05-14 DIAGNOSIS — B373 Candidiasis of vulva and vagina: Secondary | ICD-10-CM | POA: Diagnosis not present

## 2017-05-14 LAB — CBC WITH DIFFERENTIAL/PLATELET
BASOS ABS: 0 {cells}/uL (ref 0–200)
BASOS PCT: 0 %
EOS PCT: 1 %
Eosinophils Absolute: 62 cells/uL (ref 15–500)
HCT: 43.3 % (ref 35.0–45.0)
Hemoglobin: 14.5 g/dL (ref 11.7–15.5)
Lymphocytes Relative: 29 %
Lymphs Abs: 1798 cells/uL (ref 850–3900)
MCH: 32.4 pg (ref 27.0–33.0)
MCHC: 33.5 g/dL (ref 32.0–36.0)
MCV: 96.7 fL (ref 80.0–100.0)
MPV: 11.1 fL (ref 7.5–12.5)
Monocytes Absolute: 310 cells/uL (ref 200–950)
Monocytes Relative: 5 %
NEUTROS ABS: 4030 {cells}/uL (ref 1500–7800)
Neutrophils Relative %: 65 %
PLATELETS: 201 10*3/uL (ref 140–400)
RBC: 4.48 MIL/uL (ref 3.80–5.10)
RDW: 13.4 % (ref 11.0–15.0)
WBC: 6.2 10*3/uL (ref 3.8–10.8)

## 2017-05-14 LAB — WET PREP FOR TRICH, YEAST, CLUE
TRICH WET PREP: NONE SEEN
Yeast Wet Prep HPF POC: NONE SEEN

## 2017-05-14 MED ORDER — FLUCONAZOLE 150 MG PO TABS: 150.0000 mg | ORAL_TABLET | Freq: Once | ORAL | 0 refills | Status: DC

## 2017-05-14 MED ORDER — ESTRADIOL 0.05 MG/24HR TD PTTW: 1.0000 | MEDICATED_PATCH | TRANSDERMAL | 4 refills | Status: DC

## 2017-05-14 NOTE — Progress Notes (Signed)
    Colleen Gardner [birth year] [id number]        51 y.o.  G2P2002 for annual exam.  Patient also complaining of a fair number of yeast infections over the past 6 months or so which she is treated with call in Diflucan prescriptions. Was given a prescription for boric acid suppositories by Izora Gala but actually can't remember if she use them or not.  Currently she is without symptoms.  Past medical history,surgical history, problem list, medications, allergies, family history and social history were all reviewed and documented as reviewed in the EPIC chart.  ROS:  Performed with pertinent positives and negatives included in the history, assessment and plan.   Additional significant findings :  None   Exam: [name] assistant Vitals:   05/14/17 1451  BP: 114/70  Weight: 119 lb (54 kg)  Height: 5\' 5"  (1.651 m)   Body mass index is 19.8 kg/m.  General appearance:  Normal affect, orientation and appearance. Skin: Grossly normal HEENT: Without gross lesions.  No cervical or supraclavicular adenopathy. Thyroid normal.  Lungs:  Clear without wheezing, rales or rhonchi Cardiac: RR, without RMG Abdominal:  Soft, nontender, without masses, guarding, rebound, organomegaly or hernia Breasts:  Examined lying and sitting without masses, retractions, discharge or axillary adenopathy. Pelvic:  Ext, BUS, Vagina: Normal with scant white discharge  Adnexa: Without masses or tenderness    Anus and perineum: Normal   Rectovaginal: Normal sphincter tone without palpated masses or tenderness.    Assessment/Plan:  51 y.o. G66P2002 female for annual exam.   1. History of recurrent yeast infections. Wet prep today is negative. Does show clue cells but no yeast. She is asymptomatic I'm not going to treat her as bacterial vaginosis. Various approaches were reviewed to include boric acid suppositories, weekly Diflucan pills or treating symptomatic occurrences quickly with at-home prescription supply. At this  point we'll go ahead with Diflucan 150 mg #10 one by mouth at her earliest symptoms of infection and see if this does not maintain her over the next year. 2. History of TVH, A&P repair, bilateral salpingectomies 05/2015. Onset of menopausal symptoms last year with [facility] and ultimately was started Vivelle patches 0.05 mg. She does have a history of Leiden factor V mutation although has never had a thrombosis. Was screened due to her father being found to be positive. We have previously discussed this as a relative contraindication to HRT. I again reviewed this with her and the risks of thrombosis such as stroke heart attack DVT. The issues of breast cancer and HRT also discussed. The patient feels much better on her patches and feels it is a quality of life issue and wants to continue accepting the risks. Refill 1 year provided. 3. Pap smear/HPV 2016. No Pap smear done today. No history of abnormal Pap smears previously. Discussed current screening guidelines and options to stop screening altogether. Will readdress on annual basis. 4. Mammography coming due in June and I reminded her to schedule this. SBE monthly reviewed. 5. Health maintenance. Baseline CBC, CMP and urinalysis ordered. Lipid profile/TSH normal 2016 and not repeated this year. Follow up in one year, sooner as needed. 6.    Anastasio Auerbach MD, 4:03 PM 05/14/2017

## 2017-05-14 NOTE — Patient Instructions (Signed)
Take one diflucan pill as needed for yeast symtoms

## 2017-05-15 LAB — URINALYSIS W MICROSCOPIC + REFLEX CULTURE
BACTERIA UA: NONE SEEN [HPF]
Bilirubin Urine: NEGATIVE
CASTS: NONE SEEN [LPF]
CRYSTALS: NONE SEEN [HPF]
Glucose, UA: NEGATIVE
Hgb urine dipstick: NEGATIVE
Ketones, ur: NEGATIVE
Leukocytes, UA: NEGATIVE
Nitrite: NEGATIVE
PROTEIN: NEGATIVE
RBC / HPF: NONE SEEN RBC/HPF (ref <=2)
Specific Gravity, Urine: 1.012 (ref 1.001–1.035)
WBC, UA: NONE SEEN WBC/HPF (ref <=5)
Yeast: NONE SEEN [HPF]
pH: 6 (ref 5.0–8.0)

## 2017-05-15 LAB — COMPREHENSIVE METABOLIC PANEL
ALT: 13 U/L (ref 6–29)
AST: 16 U/L (ref 10–35)
Albumin: 4.2 g/dL (ref 3.6–5.1)
Alkaline Phosphatase: 50 U/L (ref 33–115)
BUN: 15 mg/dL (ref 7–25)
CHLORIDE: 102 mmol/L (ref 98–110)
CO2: 26 mmol/L (ref 20–31)
Calcium: 9.2 mg/dL (ref 8.6–10.2)
Creat: 0.89 mg/dL (ref 0.50–1.10)
Glucose, Bld: 95 mg/dL (ref 65–99)
Potassium: 3.8 mmol/L (ref 3.5–5.3)
Sodium: 139 mmol/L (ref 135–146)
TOTAL PROTEIN: 6.7 g/dL (ref 6.1–8.1)
Total Bilirubin: 0.5 mg/dL (ref 0.2–1.2)

## 2017-09-02 ENCOUNTER — Encounter: Payer: Self-pay | Admitting: Family Medicine

## 2017-09-02 ENCOUNTER — Ambulatory Visit (INDEPENDENT_AMBULATORY_CARE_PROVIDER_SITE_OTHER): Payer: 59 | Admitting: Family Medicine

## 2017-09-02 VITALS — BP 110/73 | HR 68 | Ht 65.0 in | Wt 119.0 lb

## 2017-09-02 DIAGNOSIS — M79674 Pain in right toe(s): Secondary | ICD-10-CM

## 2017-09-02 MED ORDER — METHYLPREDNISOLONE ACETATE 40 MG/ML IJ SUSP
20.0000 mg | Freq: Once | INTRAMUSCULAR | Status: AC
  Administered 2017-09-02: 20 mg via INTRA_ARTICULAR

## 2017-09-02 NOTE — Patient Instructions (Signed)
Your toe pain (at the 1st MTP joint) is due to arthritis. These are relatively difficult to treat. There are 4 classes of medicine you can use for this: Tylenol 500mg  1-2 tabs three times a day for pain. Aleve 1-2 tabs twice a day with food Curcumin, pycnogenol, Boswellia extract are supplements that may help. Capsaicin, aspercreme, or biofreeze topically up to four times a day may also help with pain. Cortisone injections are an option - you were given this today. Heat or ice 15 minutes at a time 3-4 times a day as needed to help with pain. A cheilectomy is another option if shots are not providing you enough relief.

## 2017-09-04 NOTE — Progress Notes (Signed)
PCP: Anastasio Auerbach, MD  Subjective:   HPI: Patient is a 51 y.o. female here for right great toe pain.  12/04/15: Patient reports no known injury or trauma. She's had problems with great toe for about 10 years though worse past 3 months. Saw ortho previously - given nsaids but didn't improve. Pain worse barefoot walking and after on feet a lot. Better with tennis shoes. Pain level 0/10 at rest, 5/10 with walking, 9/10 barefoot, sharp pain. Some slight swelling. No skin changes, fever, other complaints. No history of gout.  10/10/16: Patient reports she did well following last visit and cortisone injection. Over last month however started to get pain again base of right great toe. Current pain is 0/10 - worse with a lot of walking and activity, can be sharp. Pain goes across her right foot also in forefoot area. No skin changes, numbness. She does have ridges in her great toenail - has had for years.  09/02/17: Patient reports her pain in right great toe has worsened over the past several weeks. Feels like joint here is getting more swollen and misshapen. Pain is 0/10 at rest but up to 10/10 and sharp with long walking and by end of day. Inserts did not help - felt like caused more issues because foot then rubbed on top of her shoe. No skin changes, numbness.  Past Medical History:  Diagnosis Date  . Factor V Leiden mutation [facility])     Current Outpatient Prescriptions on File Prior to Visit  Medication Sig Dispense Refill  . estradiol (VIVELLE-DOT) 0.05 MG/24HR patch Place 1 patch (0.05 mg total) onto the skin 2 (two) times a week. 24 patch 4  . Ibuprofen (ADVIL PO) Take by mouth.    . Probiotic Product (PROBIOTIC PO) Take by mouth.     No current facility-administered medications on file prior to visit.     Past Surgical History:  Procedure Laterality Date  . ANTERIOR AND POSTERIOR REPAIR N/A 05/22/2015   Procedure: ANTERIOR (CYSTOCELE) AND POSTERIOR REPAIR  (RECTOCELE);  Surgeon: Anastasio Auerbach, MD;  Location: [place] [facility];  Service: Gynecology;  Laterality: N/A;  . BILATERAL SALPINGECTOMY Bilateral 05/22/2015   Procedure: BILATERAL SALPINGECTOMY;  Surgeon: Anastasio Auerbach, MD;  Location: [facility];  Service: Gynecology;  Laterality: Bilateral;  . HYSTEROSCOPY  04/2005   HYSTEROSCOPIC MYOMECTOMY  . IUD REMOVAL  08/2009   side effect symptoms after 2 months placement  . TONSILLECTOMY AND SINUS SURGERY  2006  . VAGINAL HYSTERECTOMY N/A 05/22/2015   Procedure: HYSTERECTOMY VAGINAL;  Surgeon: Anastasio Auerbach, MD;  Location: [place] [facility];  Service: Gynecology;  Laterality: N/A; FOR UTERINE PROLAPSE    Allergies  Allergen Reactions  . Clindamycin/Lincomycin Diarrhea  . Lactose Intolerance (Gi)     Social History   Social History  . Marital status: Married    Spouse name: N/A  . Number of children: N/A  . Years of education: N/A   Occupational History  . Not on file.   Social History Main Topics  . Smoking status: Never Smoker  . Smokeless tobacco: Never Used  . Alcohol use 1.8 oz/week    3 Standard drinks or equivalent per week  . Drug use: No  . Sexual activity: Yes    Birth control/ protection: Other-see comments     Comment: Vasectomy-1st intercourse 51 yo--Fewer than 5 partners   Other Topics Concern  . Not on file   Social History Narrative  . No narrative on file  Family History  Problem Relation Age of Onset  . Cirrhosis Brother   . Clotting disorder Father   . Clotting disorder Sister     BP 110/73   Pulse 68   Ht 5\' 5"  (1.651 m)   Wt 119 lb (54 kg)   LMP 04/20/2015   BMI 19.80 kg/m   Review of Systems: See HPI above.    Objective:  Physical Exam:  Gen: NAD  Right foot: Mild hallux valgus.  Mild hallux rigidus.  No other abnormalities, bruising, deformity. TTP 1st MTP dorsally.  No other tenderness. FROM ankle, digits except mild limitation dorsiflexion of 1st MTP. NVI distally.  Left  foot/ankle: FROM without pain.    Assessment & Plan:  1. Right great toe pain - Consistent with DJD - spurring seen dorsally on ultrasound again.  Sports insoles with 1st ray post did not provide benefit because of increased rubbing on the area of pain.  Injection repeated today.  Again discussed tylenol, nsaids, supplements that may help, topical medications.  Heat or ice as needed.  F/u in 6 weeks or prn.  We also discussed possible referral for cheilectomy.  After informed written consent patient timeout was performed and patient was seated in chair of exam room.  Area overlying medial aspect right 1st MTP identified with ultrasound then prepped with alcohol swab and injected with 0.5: 0.61mL bupivicaine: depomedrol.  Patient tolerated procedure well without immediate complications.

## 2017-09-04 NOTE — Assessment & Plan Note (Signed)
Consistent with DJD - spurring seen dorsally on ultrasound again.  Sports insoles with 1st ray post did not provide benefit because of increased rubbing on the area of pain.  Injection repeated today.  Again discussed tylenol, nsaids, supplements that may help, topical medications.  Heat or ice as needed.  F/u in 6 weeks or prn.  We also discussed possible referral for cheilectomy.  After informed written consent patient timeout was performed and patient was seated in chair of exam room.  Area overlying medial aspect right 1st MTP identified with ultrasound then prepped with alcohol swab and injected with 0.5: 0.65mL bupivicaine: depomedrol.  Patient tolerated procedure well without immediate complications.

## 2017-09-30 ENCOUNTER — Other Ambulatory Visit: Payer: Self-pay | Admitting: Gynecology

## 2018-07-09 ENCOUNTER — Encounter: Payer: Self-pay | Admitting: Gynecology

## 2018-07-09 ENCOUNTER — Ambulatory Visit: Payer: 59 | Admitting: Gynecology

## 2018-07-09 VITALS — BP 118/74 | Ht 66.0 in | Wt 120.0 lb

## 2018-07-09 DIAGNOSIS — Z13220 Encounter for screening for lipoid disorders: Secondary | ICD-10-CM | POA: Diagnosis not present

## 2018-07-09 DIAGNOSIS — N951 Menopausal and female climacteric states: Secondary | ICD-10-CM | POA: Diagnosis not present

## 2018-07-09 DIAGNOSIS — Z01419 Encounter for gynecological examination (general) (routine) without abnormal findings: Secondary | ICD-10-CM | POA: Diagnosis not present

## 2018-07-09 DIAGNOSIS — Z79890 Hormone replacement therapy (postmenopausal): Secondary | ICD-10-CM | POA: Diagnosis not present

## 2018-07-09 LAB — COMPREHENSIVE METABOLIC PANEL
AG Ratio: 1.8 (calc) (ref 1.0–2.5)
ALT: 15 U/L (ref 6–29)
AST: 17 U/L (ref 10–35)
Albumin: 4.6 g/dL (ref 3.6–5.1)
Alkaline phosphatase (APISO): 53 U/L (ref 33–115)
BUN: 15 mg/dL (ref 7–25)
CO2: 29 mmol/L (ref 20–32)
Calcium: 9.2 mg/dL (ref 8.6–10.2)
Chloride: 101 mmol/L (ref 98–110)
Creat: 0.81 mg/dL (ref 0.50–1.10)
Globulin: 2.5 g/dL (calc) (ref 1.9–3.7)
Glucose, Bld: 94 mg/dL (ref 65–99)
POTASSIUM: 3.6 mmol/L (ref 3.5–5.3)
Sodium: 137 mmol/L (ref 135–146)
Total Bilirubin: 0.5 mg/dL (ref 0.2–1.2)
Total Protein: 7.1 g/dL (ref 6.1–8.1)

## 2018-07-09 LAB — LIPID PANEL
Cholesterol: 184 mg/dL (ref <200)
HDL: 82 mg/dL (ref >50)
LDL Cholesterol (Calc): 86 mg/dL (calc)
Non-HDL Cholesterol (Calc): 102 mg/dL (calc) (ref <130)
Total CHOL/HDL Ratio: 2.2 (calc) (ref <5.0)
Triglycerides: 71 mg/dL (ref <150)

## 2018-07-09 LAB — CBC WITH DIFFERENTIAL/PLATELET
Basophils Absolute: 51 cells/uL (ref 0–200)
Basophils Relative: 0.8 %
Eosinophils Absolute: 38 cells/uL (ref 15–500)
Eosinophils Relative: 0.6 %
HEMATOCRIT: 43.5 % (ref 35.0–45.0)
HEMOGLOBIN: 15.2 g/dL (ref 11.7–15.5)
LYMPHS ABS: 1357 {cells}/uL (ref 850–3900)
MCH: 33.6 pg — ABNORMAL HIGH (ref 27.0–33.0)
MCHC: 34.9 g/dL (ref 32.0–36.0)
MCV: 96 fL (ref 80.0–100.0)
MPV: 11.8 fL (ref 7.5–12.5)
Monocytes Relative: 6.8 %
NEUTROS ABS: 4518 {cells}/uL (ref 1500–7800)
Neutrophils Relative %: 70.6 %
Platelets: 199 10*3/uL (ref 140–400)
RBC: 4.53 10*6/uL (ref 3.80–5.10)
RDW: 11.9 % (ref 11.0–15.0)
Total Lymphocyte: 21.2 %
WBC mixed population: 435 cells/uL (ref 200–950)
WBC: 6.4 10*3/uL (ref 3.8–10.8)

## 2018-07-09 MED ORDER — VENLAFAXINE HCL ER 37.5 MG PO CP24: 37.5000 mg | ORAL_CAPSULE | Freq: Every day | ORAL | 12 refills | Status: DC

## 2018-07-09 NOTE — Progress Notes (Signed)
    Colleen Gardner [birth year] [id number]        52 y.o.  G2P2002 for annual gynecologic exam.  Patient notes while wearing her estradiol patch she has abdominal bloating and swelling and constipation.  When she takes off the patch the symptoms resolved but then she has significant hot flushes and sweats.  Past medical history,surgical history, problem list, medications, allergies, family history and social history were all reviewed and documented as reviewed in the EPIC chart.  ROS:  Performed with pertinent positives and negatives included in the history, assessment and plan.   Additional significant findings : None   Exam: [name] assistant Vitals:   07/09/18 1228  BP: 118/74  Weight: 120 lb (54.4 kg)  Height: 5\' 6"  (1.676 m)   Body mass index is 19.37 kg/m.  General appearance:  Normal affect, orientation and appearance. Skin: Grossly normal HEENT: Without gross lesions.  No cervical or supraclavicular adenopathy. Thyroid normal.  Lungs:  Clear without wheezing, rales or rhonchi Cardiac: RR, without RMG Abdominal:  Soft, nontender, without masses, guarding, rebound, organomegaly or hernia Breasts:  Examined lying and sitting without masses, retractions, discharge or axillary adenopathy. Pelvic:  Ext, BUS, Vagina: Normal  Adnexa: Without masses or tenderness    Anus and perineum: Normal   Rectovaginal: Normal sphincter tone without palpated masses or tenderness.    Assessment/Plan:  52 y.o. [id number] female for annual gynecologic exam status post TVH, A&P repair with bilateral salpingectomies 2016..   1. Menopausal symptoms.  Relates abdominal symptoms to the estradiol patch.  They resolve when she takes the patch off but then she has intolerable hot flushes and sweats.  We discussed this is an unusual reaction to estradiol.  Whether changing to an oral dose would make a difference question.  She does have a history of Leiden factor V mutation although has never had a  thrombosis.  It was drawn due to a family history of thrombosis.  We have already discussed the possible increased risk of thrombosis with the patch.  We discussed with oral estradiol there may be a higher risk of thrombosis.  Alternatives to include trial of Effexor reviewed.  Side effect profile discussed.  Patient wants to go ahead and try and will stop the patch and start Effexor XR 37.5.  She will call after 1 to 2 months if she is not having a satisfactory response and will increase to the 75 mg.  What to look out for to include depression/suicide ideology discussed. 2. Pap smear/HPV 2016.  No Pap smear done today.  No history of significant abnormal Pap smears.  Discussed current screening guidelines recommendation to stop screening after hysterectomy.  Will readdress on an annual basis. 3. Mammography due now and patient will schedule in follow-up for this.  Breast exam normal today. 4. Health maintenance.  Baseline CBC, CMP and lipid profile ordered.  Follow-up with response to Effexor.  Follow-up in 1 year for annual exam.   Anastasio Auerbach MD, 1:07 PM 07/09/2018

## 2018-07-09 NOTE — Patient Instructions (Signed)
Start on the Effexor for menopausal symptoms as we discussed.  Call me if you have any issues with this.

## 2018-07-12 ENCOUNTER — Telehealth: Payer: Self-pay | Admitting: *Deleted

## 2018-07-12 MED ORDER — ESTRADIOL 0.0375 MG/24HR TD PTTW: 1.0000 | MEDICATED_PATCH | TRANSDERMAL | 3 refills | Status: DC

## 2018-07-12 MED ORDER — ESTRADIOL 0.0375 MG/24HR TD PTTW: 1.0000 | MEDICATED_PATCH | TRANSDERMAL | 0 refills | Status: DC

## 2018-07-12 NOTE — Telephone Encounter (Signed)
Patient aware, 30 day supply sent to local pharmacy and 90 with 3 refills sent to mail order. Per patient request.

## 2018-07-12 NOTE — Telephone Encounter (Signed)
Patient would like to lower vivelle dot patch 0.05%,she does not want to start the effexor-XR 37.5. Please advise

## 2018-07-12 NOTE — Telephone Encounter (Signed)
Okay for Vivelle 0.0375 mg patch twice weekly refill x1 year

## 2018-07-30 DIAGNOSIS — Z1231 Encounter for screening mammogram for malignant neoplasm of breast: Secondary | ICD-10-CM | POA: Diagnosis not present

## 2018-08-06 ENCOUNTER — Other Ambulatory Visit: Payer: Self-pay | Admitting: Gynecology

## 2019-02-02 ENCOUNTER — Ambulatory Visit: Payer: 59 | Admitting: Family Medicine

## 2019-02-02 ENCOUNTER — Encounter: Payer: Self-pay | Admitting: Family Medicine

## 2019-02-02 VITALS — BP 120/86 | HR 62 | Ht 65.0 in | Wt 120.0 lb

## 2019-02-02 DIAGNOSIS — M79674 Pain in right toe(s): Secondary | ICD-10-CM | POA: Diagnosis not present

## 2019-02-02 MED ORDER — METHYLPREDNISOLONE ACETATE 40 MG/ML IJ SUSP
20.0000 mg | Freq: Once | INTRAMUSCULAR | Status: AC
  Administered 2019-02-02: 20 mg via INTRA_ARTICULAR

## 2019-02-02 NOTE — Patient Instructions (Signed)
Your toe pain (at the 1st MTP joint) is due to arthritis. These are relatively difficult to treat. There are 4 classes of medicine you can use for this: Tylenol 500mg  1-2 tabs three times a day for pain. Aleve 1-2 tabs twice a day with food Curcumin, pycnogenol, Boswellia extract are supplements that may help. Capsaicin, aspercreme, or biofreeze topically up to four times a day may also help with pain. Cortisone injections are an option - you were given this today. Heat or ice 15 minutes at a time 3-4 times a day as needed to help with pain. A cheilectomy is another option if shots are not providing you enough relief. Follow up as needed.

## 2019-02-03 ENCOUNTER — Encounter: Payer: Self-pay | Admitting: Family Medicine

## 2019-02-03 NOTE — Progress Notes (Signed)
PCP: Anastasio Auerbach, MD  Subjective:   HPI: Patient is a 53 y.o. female here for right great toe pain.  12/04/15: Patient reports no known injury or trauma. She's had problems with great toe for about 10 years though worse past 3 months. Saw ortho previously - given nsaids but didn't improve. Pain worse barefoot walking and after on feet a lot. Better with tennis shoes. Pain level 0/10 at rest, 5/10 with walking, 9/10 barefoot, sharp pain. Some slight swelling. No skin changes, fever, other complaints. No history of gout.  10/10/16: Patient reports she did well following last visit and cortisone injection. Over last month however started to get pain again base of right great toe. Current pain is 0/10 - worse with a lot of walking and activity, can be sharp. Pain goes across her right foot also in forefoot area. No skin changes, numbness. She does have ridges in her great toenail - has had for years.  09/02/17: Patient reports her pain in right great toe has worsened over the past several weeks. Feels like joint here is getting more swollen and misshapen. Pain is 0/10 at rest but up to 10/10 and sharp with long walking and by end of day. Inserts did not help - felt like caused more issues because foot then rubbed on top of her shoe. No skin changes, numbness.  02/02/19: Patient reports pain has returned over past few months and has delayed coming back in. Injection has helped a lot previously. Not taking any medication or using inserts for this. Has been using ice and heat. Pain 3/10 but up to 8/10 at times. No skin changes.  Past Medical History:  Diagnosis Date  . Factor V Leiden mutation [facility])     Current Outpatient Medications on File Prior to Visit  Medication Sig Dispense Refill  . estradiol (VIVELLE-DOT) 0.0375 MG/24HR     . Probiotic Product (PROBIOTIC PO) Take by mouth.    . venlafaxine XR (EFFEXOR-XR) 37.5 MG 24 hr capsule Take 1 capsule (37.5 mg total)  by mouth daily with breakfast. 30 capsule 12   No current facility-administered medications on file prior to visit.     Past Surgical History:  Procedure Laterality Date  . ANTERIOR AND POSTERIOR REPAIR N/A 05/22/2015   Procedure: ANTERIOR (CYSTOCELE) AND POSTERIOR REPAIR (RECTOCELE);  Surgeon: Anastasio Auerbach, MD;  Location: [place] [facility];  Service: Gynecology;  Laterality: N/A;  . BILATERAL SALPINGECTOMY Bilateral 05/22/2015   Procedure: BILATERAL SALPINGECTOMY;  Surgeon: Anastasio Auerbach, MD;  Location: [place] [facility];  Service: Gynecology;  Laterality: Bilateral;  . HYSTEROSCOPY  04/2005   HYSTEROSCOPIC MYOMECTOMY  . IUD REMOVAL  08/2009   side effect symptoms after 2 months placement  . TONSILLECTOMY AND SINUS SURGERY  2006  . VAGINAL HYSTERECTOMY N/A 05/22/2015   Procedure: HYSTERECTOMY VAGINAL;  Surgeon: Anastasio Auerbach, MD;  Location: [place] [facility];  Service: Gynecology;  Laterality: N/A; FOR UTERINE PROLAPSE    Allergies  Allergen Reactions  . Clindamycin/Lincomycin Diarrhea  . Lactose Intolerance (Gi)     Social History   Socioeconomic History  . Marital status: Married    Spouse name: Not on file  . Number of children: Not on file  . Years of education: Not on file  . Highest education level: Not on file  Occupational History  . Not on file  Social Needs  . Financial resource strain: Not on file  . Food insecurity:    Worry: Not on file    Inability: Not  on file  . Transportation needs:    Medical: Not on file    Non-medical: Not on file  Tobacco Use  . Smoking status: Never Smoker  . Smokeless tobacco: Never Used  Substance and Sexual Activity  . Alcohol use: Yes    Alcohol/week: 3.0 standard drinks    Types: 3 Standard drinks or equivalent per week  . Drug use: No  . Sexual activity: Yes    Birth control/protection: Other-see comments    Comment: Vasectomy-1st intercourse 53 yo--Fewer than 5 partners  Lifestyle  . Physical activity:    Days per week: Not on file     Minutes per session: Not on file  . Stress: Not on file  Relationships  . Social connections:    Talks on phone: Not on file    Gets together: Not on file    Attends religious service: Not on file    Active member of club or organization: Not on file    Attends meetings of clubs or organizations: Not on file    Relationship status: Not on file  . Intimate partner violence:    Fear of current or ex partner: Not on file    Emotionally abused: Not on file    Physically abused: Not on file    Forced sexual activity: Not on file  Other Topics Concern  . Not on file  Social History Narrative  . Not on file    Family History  Problem Relation Age of Onset  . Cirrhosis Brother   . Clotting disorder Father   . Clotting disorder Sister     BP 120/86   Pulse 62   Ht 5\' 5"  (1.651 m)   Wt 120 lb (54.4 kg)   LMP 04/20/2015   BMI 19.97 kg/m   Review of Systems: See HPI above.    Objective:  Physical Exam:  Gen: NAD, comfortable in exam room  Right foot/ankle: Mild hallux valgus and rigidus.  No other abnormalities. TTP 1st MTP dorsally and medially.  No other tenderness. FROM ankle, other digits with 5/5 strength. NVI distally.  MSK u/s:  Mild effusion right 1st MTP with dorsal spurring, small spur also medially off proximal aspect of joint.    Assessment & Plan:  1. Right great toe pain - 2/2 DJD 1st MTP.  Did not tolerate inserts with 1st ray post.  Repeated injection today.  Tylenol, aleve, topical medications, supplements reviewed again.  Heat or ice if needed.  Consider referral for cheilectomy if not improving.  F/u prn otherwise.  After informed written consent timeout was performed and patient was seated on exam table.  Area overlying dorsomedial aspect of 1st MTP prepped with alcohol swab then utilizing ultrasound guidance patient's right 1st MTP was injected with 0.5:0.14mL bupivicaine: depomedrol.  Patient tolerated procedure well without immediate complications.

## 2019-03-01 ENCOUNTER — Other Ambulatory Visit: Payer: Self-pay | Admitting: Gynecology

## 2019-05-30 ENCOUNTER — Other Ambulatory Visit: Payer: Self-pay | Admitting: Gynecology

## 2019-07-08 ENCOUNTER — Other Ambulatory Visit: Payer: Self-pay

## 2019-07-11 ENCOUNTER — Ambulatory Visit (INDEPENDENT_AMBULATORY_CARE_PROVIDER_SITE_OTHER): Payer: 59 | Admitting: Gynecology

## 2019-07-11 ENCOUNTER — Encounter: Payer: Self-pay | Admitting: Gynecology

## 2019-07-11 ENCOUNTER — Other Ambulatory Visit: Payer: Self-pay

## 2019-07-11 VITALS — BP 110/78 | Ht 65.0 in | Wt 114.0 lb

## 2019-07-11 DIAGNOSIS — Z1329 Encounter for screening for other suspected endocrine disorder: Secondary | ICD-10-CM

## 2019-07-11 DIAGNOSIS — Z79890 Hormone replacement therapy (postmenopausal): Secondary | ICD-10-CM | POA: Diagnosis not present

## 2019-07-11 DIAGNOSIS — Z13220 Encounter for screening for lipoid disorders: Secondary | ICD-10-CM

## 2019-07-11 DIAGNOSIS — Z01419 Encounter for gynecological examination (general) (routine) without abnormal findings: Secondary | ICD-10-CM | POA: Diagnosis not present

## 2019-07-11 MED ORDER — FLUCONAZOLE 150 MG PO TABS: 150.0000 mg | ORAL_TABLET | Freq: Once | ORAL | 0 refills | Status: AC

## 2019-07-11 MED ORDER — ESTRADIOL 0.0375 MG/24HR TD PTTW: 1.0000 | MEDICATED_PATCH | TRANSDERMAL | 4 refills | Status: DC

## 2019-07-11 NOTE — Progress Notes (Signed)
    Colleen Gardner 1966/12/13 [id number]        53 y.o.  [id number] for annual gynecologic exam.  Was having issues of bloating and constipation with the 0.05 patch last year.  Was to start on Effexor but then called back decided she like to try a lower dose patch.  She was placed on a 0.375 patch and is done well with this.  Past medical history,surgical history, problem list, medications, allergies, family history and social history were all reviewed and documented as reviewed in the EPIC chart.  ROS:  Performed with pertinent positives and negatives included in the history, assessment and plan.   Additional significant findings : None   Exam: Wandra Scot assistant Vitals:   07/11/19 1445  BP: 110/78  Weight: 114 lb (51.7 kg)  Height: 5\' 5"  (1.651 m)   Body mass index is 18.97 kg/m.  General appearance:  Normal affect, orientation and appearance. Skin: Grossly normal HEENT: Without gross lesions.  No cervical or supraclavicular adenopathy. Thyroid normal.  Lungs:  Clear without wheezing, rales or rhonchi Cardiac: RR, without RMG Abdominal:  Soft, nontender, without masses, guarding, rebound, organomegaly or hernia Breasts:  Examined lying and sitting without masses, retractions, discharge or axillary adenopathy. Pelvic:  Ext, BUS, Vagina: Normal.  Pap smear of vaginal cuff done  Adnexa: Without masses or tenderness    Anus and perineum: Normal   Rectovaginal: Normal sphincter tone without palpated masses or tenderness.    Assessment/Plan:  53 y.o. [id number] female for annual gynecologic exam.  Status post TVH, A&P repair and bilateral salpingectomies 2016.  1. HRT.  On 0.0375 mg patch doing well with control of her hot flushes and sweats.  We have discussed the risks versus benefits when she was placed on the 0.05 mg patch.  She has a history of Leiden factor V mutation although never had a thrombosis.  It was checked due to a family history of thrombosis.  We have discussed the  possible increased risk of thrombosis such as stroke heart attack DVT and the whole breast cancer issue.  She is comfortable continuing on the lower dose patch and I refilled her times a year. 2. History of recurrent yeast infections.  She asked for a prescription for Diflucan to have available in the case of recurrent yeast that she has had an issue with before.  Diflucan 150 mg #5 given to use PRN. 3. Pap smear/HPV 2016.  Pap smear of vaginal cuff today.  No history of significant abnormal Pap smears.  Options to stop screening per current screening guidelines reviewed.  Will readdress on annual basis. 4. Mammography coming due next month as she is in the process of arranging.  Breast exam normal today. 5. Health maintenance.  Baseline CBC, CMP and lipid profile ordered.  Follow-up 1 year, sooner as needed.   Anastasio Auerbach MD, 3:13 PM 07/11/2019

## 2019-07-11 NOTE — Addendum Note (Signed)
Addended by: Lorine Bears on: 07/11/2019 03:19 PM   Modules accepted: Orders

## 2019-07-11 NOTE — Patient Instructions (Signed)
Follow-up in 1 year for annual exam, sooner if any issues. 

## 2019-07-12 LAB — CBC WITH DIFFERENTIAL/PLATELET
Absolute Monocytes: 348 cells/uL (ref 200–950)
Basophils Absolute: 49 cells/uL (ref 0–200)
Basophils Relative: 0.8 %
Eosinophils Absolute: 67 cells/uL (ref 15–500)
Eosinophils Relative: 1.1 %
HCT: 42.9 % (ref 35.0–45.0)
Hemoglobin: 14.6 g/dL (ref 11.7–15.5)
Lymphs Abs: 1562 cells/uL (ref 850–3900)
MCH: 33.4 pg — ABNORMAL HIGH (ref 27.0–33.0)
MCHC: 34 g/dL (ref 32.0–36.0)
MCV: 98.2 fL (ref 80.0–100.0)
MPV: 11.5 fL (ref 7.5–12.5)
Monocytes Relative: 5.7 %
Neutro Abs: 4075 cells/uL (ref 1500–7800)
Neutrophils Relative %: 66.8 %
Platelets: 176 10*3/uL (ref 140–400)
RBC: 4.37 10*6/uL (ref 3.80–5.10)
RDW: 12.1 % (ref 11.0–15.0)
Total Lymphocyte: 25.6 %
WBC: 6.1 10*3/uL (ref 3.8–10.8)

## 2019-07-12 LAB — COMPREHENSIVE METABOLIC PANEL
AG Ratio: 1.4 (calc) (ref 1.0–2.5)
ALT: 11 U/L (ref 6–29)
AST: 18 U/L (ref 10–35)
Albumin: 4.2 g/dL (ref 3.6–5.1)
Alkaline phosphatase (APISO): 43 U/L (ref 37–153)
BUN: 18 mg/dL (ref 7–25)
CO2: 28 mmol/L (ref 20–32)
Calcium: 9.1 mg/dL (ref 8.6–10.4)
Chloride: 101 mmol/L (ref 98–110)
Creat: 0.88 mg/dL (ref 0.50–1.05)
Globulin: 2.9 g/dL (calc) (ref 1.9–3.7)
Glucose, Bld: 86 mg/dL (ref 65–99)
Potassium: 3.7 mmol/L (ref 3.5–5.3)
Sodium: 136 mmol/L (ref 135–146)
Total Bilirubin: 0.4 mg/dL (ref 0.2–1.2)
Total Protein: 7.1 g/dL (ref 6.1–8.1)

## 2019-07-12 LAB — LIPID PANEL
Cholesterol: 174 mg/dL (ref <200)
HDL: 75 mg/dL (ref >=50)
LDL Cholesterol (Calc): 78 mg/dL (calc)
Non-HDL Cholesterol (Calc): 99 mg/dL (calc) (ref <130)
Total CHOL/HDL Ratio: 2.3 (calc) (ref <5.0)
Triglycerides: 124 mg/dL (ref <150)

## 2019-07-12 LAB — PAP IG W/ RFLX HPV ASCU

## 2019-07-12 LAB — TSH: TSH: 1.83 mIU/L

## 2019-09-07 ENCOUNTER — Encounter: Payer: Self-pay | Admitting: Gynecology

## 2020-07-05 ENCOUNTER — Ambulatory Visit: Payer: 59 | Admitting: Obstetrics & Gynecology

## 2020-07-05 ENCOUNTER — Other Ambulatory Visit: Payer: Self-pay

## 2020-07-05 ENCOUNTER — Encounter: Payer: Self-pay | Admitting: Obstetrics & Gynecology

## 2020-07-05 VITALS — BP 126/80

## 2020-07-05 DIAGNOSIS — N898 Other specified noninflammatory disorders of vagina: Secondary | ICD-10-CM

## 2020-07-05 DIAGNOSIS — R300 Dysuria: Secondary | ICD-10-CM | POA: Diagnosis not present

## 2020-07-05 MED ORDER — SULFAMETHOXAZOLE-TRIMETHOPRIM 800-160 MG PO TABS: 1.0000 | ORAL_TABLET | Freq: Two times a day (BID) | ORAL | 0 refills | Status: AC

## 2020-07-05 MED ORDER — FLUCONAZOLE 150 MG PO TABS
150.0000 mg | ORAL_TABLET | Freq: Every day | ORAL | 3 refills | Status: AC
Start: 2020-07-05 — End: 2020-07-08

## 2020-07-05 NOTE — Progress Notes (Signed)
    Colleen Gardner [birth year] [id number]        54 y.o.  G2P2002   RP: Dysuria and frequency for about 4 days with hematuria this morning  HPI: Dysuria and frequency for about 4 days with hematuria this morning.  Some pelvic discomfort.  Vaginal discomfort as well with no abnormal discharge.  No fever.   OB History  Gravida Para Term Preterm AB Living  2 2 2    0 2  SAB TAB Ectopic Multiple Live Births               # Outcome Date GA Lbr Len/2nd Weight Sex Delivery Anes PTL Lv  2 Term           1 Term             Past medical history,surgical history, problem list, medications, allergies, family history and social history were all reviewed and documented in the EPIC chart.   Directed ROS with pertinent positives and negatives documented in the history of present illness/assessment and plan.  Exam:  Vitals:   07/05/20 1122  BP: 126/80   General appearance:  Normal  CVAT Negative bilaterally  Abdomen: Normal  Gynecologic exam: Vulva normal.  No increased vaginal d/c.  U/A: Slightly cloudy, yellow, protein 1+, nitrites negative, white blood cells more than 60, red blood cells 10-20, bacteria few.  Urine culture pending.   Assessment/Plan:  54 y.o. G2P2002   1. Dysuria Probable acute cystitis.  Decision to treat with Bactrim DS 1 tablet twice a day for 3 days.  Usage reviewed and prescription sent to pharmacy. - Urinalysis,Complete w/RFL Culture  2. Vaginal irritation Mild vaginal irritation with no abnormal discharge.  Tends to develop yeast infection after antibiotic treatment.  Fluconazole 150 mg 1 tablet daily for 3 days prescribed.  May take 1 tablet during the course of antibiotic treatment if very symptomatic.  Keep at least 1 tablet to be taken after finishing the antibiotics.  Other orders - Calcium Carb-Cholecalciferol (CALCIUM 1000 + D PO); Take by mouth. - Urine Culture - REFLEXIVE URINE CULTURE - sulfamethoxazole-trimethoprim (BACTRIM DS) 800-160 MG  tablet; Take 1 tablet by mouth 2 (two) times daily for 3 days. - fluconazole (DIFLUCAN) 150 MG tablet; Take 1 tablet (150 mg total) by mouth daily for 3 days.  Princess Bruins MD, 12:01 PM 07/05/2020

## 2020-07-06 LAB — URINALYSIS, COMPLETE W/RFL CULTURE
Bilirubin Urine: NEGATIVE
Glucose, UA: NEGATIVE
Hyaline Cast: NONE SEEN /LPF
Ketones, ur: NEGATIVE
Nitrites, Initial: NEGATIVE
Specific Gravity, Urine: 1.015 (ref 1.001–1.03)
WBC, UA: >=60 /HPF — AB (ref 0–5)
pH: 7.5 (ref 5.0–8.0)

## 2020-07-06 LAB — URINE CULTURE
MICRO NUMBER:: 10737097
Result:: NO GROWTH
SPECIMEN QUALITY:: ADEQUATE

## 2020-07-06 LAB — CULTURE INDICATED

## 2020-07-16 ENCOUNTER — Encounter: Payer: Self-pay | Admitting: *Deleted

## 2020-08-07 ENCOUNTER — Other Ambulatory Visit: Payer: Self-pay

## 2020-08-07 ENCOUNTER — Encounter: Payer: Self-pay | Admitting: Family

## 2020-08-07 ENCOUNTER — Ambulatory Visit: Payer: 59 | Admitting: Family

## 2020-08-07 VITALS — BP 123/73 | HR 72 | Temp 98.5°F | Resp 16 | Ht 65.0 in | Wt 112.0 lb

## 2020-08-07 DIAGNOSIS — N951 Menopausal and female climacteric states: Secondary | ICD-10-CM

## 2020-08-07 DIAGNOSIS — D6851 Activated protein C resistance: Secondary | ICD-10-CM | POA: Diagnosis not present

## 2020-08-07 DIAGNOSIS — Z0000 Encounter for general adult medical examination without abnormal findings: Secondary | ICD-10-CM

## 2020-08-07 DIAGNOSIS — Z23 Encounter for immunization: Secondary | ICD-10-CM

## 2020-08-07 MED ORDER — ESTRADIOL 0.0375 MG/24HR TD PTTW: 1.0000 | MEDICATED_PATCH | TRANSDERMAL | 4 refills | Status: DC

## 2020-08-07 MED ORDER — ASPIRIN EC 81 MG PO TBEC: 81.0000 mg | DELAYED_RELEASE_TABLET | Freq: Every day | ORAL | 11 refills | Status: AC

## 2020-08-07 NOTE — Patient Instructions (Addendum)
Please complete lab work prior to leaving. You should be contacted about scheduling your appointment with Hematology. Try to add 30 days a walking 5 days a week. Schedule a routine eye exam at your convenience. You should be contacted about scheduling your colonoscopy.  Welcome to [facility]!

## 2020-08-07 NOTE — Progress Notes (Signed)
Subjective:    Patient ID: Colleen Gardner, female    DOB: 06/13/1966, 54 y.o.   MRN: [id number]  HPI  Patient is a 54 yr old female who presents today to establish care.  Pmhx is significant for Factor V Leiden Mutation. Reports that her father had hx of blood clot and her sister had blood clot.  She has never seen hematology. She is a heterozygote.    Patient presents today for complete physical.  Immunizations: Shingrix is due Diet: fair diet Exercise: no regular exercise  Colonoscopy: due Mammogram: 05/03/2020 Vision: due Dental: up to date Wt Readings from Last 3 Encounters:  08/07/20 112 lb (50.8 kg)  07/11/19 114 lb (51.7 kg)  02/02/19 120 lb (54.4 kg)             Review of Systems  Constitutional: Positive for unexpected weight change.  HENT: Negative for hearing loss and rhinorrhea.   Eyes: Negative for visual disturbance.  Respiratory: Negative for cough and shortness of breath.   Cardiovascular: Negative for chest pain.  Gastrointestinal: Negative for blood in stool, constipation and diarrhea.  Genitourinary: Negative for dysuria, frequency and hematuria.  Musculoskeletal: Positive for arthralgias (occasional pain in her left great toe- has seen Dr. Barbaraann Barthel). Negative for myalgias.  Skin: Negative for rash.  Neurological: Negative for headaches.  Hematological: Negative for adenopathy.  Psychiatric/Behavioral:       Denies depression/anxiety    Past Medical History:  Diagnosis Date  . Factor V Leiden mutation [facility])      Social History   Socioeconomic History  . Marital status: Married    Spouse name: Not on file  . Number of children: Not on file  . Years of education: Not on file  . Highest education level: Not on file  Occupational History  . Not on file  Tobacco Use  . Smoking status: Never Smoker  . Smokeless tobacco: Never Used  Vaping Use  . Vaping Use: Never used  Substance and Sexual Activity  . Alcohol use: Yes     Alcohol/week: 1.0 standard drink    Types: 1 Glasses of wine per week    Comment: 3 times a week  . Drug use: No  . Sexual activity: Yes    Partners: Male    Birth control/protection: Other-see comments, None    Comment: Vasectomy-1st intercourse 54 yo--Fewer than 5 partners  Other Topics Concern  . Not on file  Social History Narrative  . Not on file   Social Determinants of Health   Financial Resource Strain:   . Difficulty of Paying Living Expenses: Not on file  Food Insecurity:   . Worried About Charity fundraiser in the Last Year: Not on file  . Ran Out of Food in the Last Year: Not on file  Transportation Needs:   . Lack of Transportation (Medical): Not on file  . Lack of Transportation (Non-Medical): Not on file  Physical Activity:   . Days of Exercise per Week: Not on file  . Minutes of Exercise per Session: Not on file  Stress:   . Feeling of Stress : Not on file  Social Connections:   . Frequency of Communication with Friends and Family: Not on file  . Frequency of Social Gatherings with Friends and Family: Not on file  . Attends Religious Services: Not on file  . Active Member of Clubs or Organizations: Not on file  . Attends Archivist Meetings: Not on file  . Marital  Status: Not on file  Intimate Partner Violence:   . Fear of Current or Ex-Partner: Not on file  . Emotionally Abused: Not on file  . Physically Abused: Not on file  . Sexually Abused: Not on file    Past Surgical History:  Procedure Laterality Date  . ANTERIOR AND POSTERIOR REPAIR N/A 05/22/2015   Procedure: ANTERIOR (CYSTOCELE) AND POSTERIOR REPAIR (RECTOCELE);  Surgeon: Anastasio Auerbach, MD;  Location: [place] [facility];  Service: Gynecology;  Laterality: N/A;  . BILATERAL SALPINGECTOMY Bilateral 05/22/2015   Procedure: BILATERAL SALPINGECTOMY;  Surgeon: Anastasio Auerbach, MD;  Location: [place] [facility];  Service: Gynecology;  Laterality: Bilateral;  . HYSTEROSCOPY  04/2005   HYSTEROSCOPIC MYOMECTOMY   . IUD REMOVAL  08/2009   side effect symptoms after 2 months placement  . TONSILLECTOMY AND SINUS SURGERY  2006  . VAGINAL HYSTERECTOMY N/A 05/22/2015   Procedure: HYSTERECTOMY VAGINAL;  Surgeon: Anastasio Auerbach, MD;  Location: [place] [facility];  Service: Gynecology;  Laterality: N/A; FOR UTERINE PROLAPSE    Family History  Problem Relation Age of Onset  . Cirrhosis Brother   . Clotting disorder Father   . Clotting disorder Sister   . Hypertension Mother   . Lung cancer Paternal Grandfather     Allergies  Allergen Reactions  . Clindamycin/Lincomycin Diarrhea  . Lactose Intolerance (Gi)     Current Outpatient Medications on File Prior to Visit  Medication Sig Dispense Refill  . Calcium Carb-Cholecalciferol (CALCIUM 1000 + D PO) Take by mouth.    . estradiol (VIVELLE-DOT) 0.0375 MG/24HR Place 1 patch onto the skin 2 (two) times a week. 24 patch 4  . Probiotic Product (PROBIOTIC PO) Take by mouth.     No current facility-administered medications on file prior to visit.    BP 123/73 (BP Location: Right Arm, Patient Position: Sitting, Cuff Size: Small)   Pulse 72   Temp 98.5 F (36.9 C) (Oral)   Resp 16   Ht 5\' 5"  (1.651 m)   Wt 112 lb (50.8 kg)   LMP 04/20/2015   SpO2 100%   BMI 18.64 kg/m    Objective:   Physical Exam  Physical Exam  Constitutional: She is oriented to person, place, and time. She appears well-developed and well-nourished. No distress.  HENT:  Head: Normocephalic and atraumatic.  Right Ear: Tympanic membrane and ear canal normal.  Left Ear: Tympanic membrane and ear canal normal.  Mouth/Throat: not examined- pt wearing mask Eyes: Pupils are equal, round, and reactive to light. No scleral icterus.  Neck: Normal range of motion. No thyromegaly present.  Cardiovascular: Normal rate and regular rhythm.   No murmur heard. Pulmonary/Chest: Effort normal and breath sounds normal. No respiratory distress. He has no wheezes. She has no rales. She exhibits no  tenderness.  Abdominal: Soft. Bowel sounds are normal. She exhibits no distension and no mass. There is no tenderness. There is no rebound and no guarding.  Musculoskeletal: She exhibits no edema.  Lymphadenopathy:    She has no cervical adenopathy.  Neurological: She is alert and oriented to person, place, and time. She has normal patellar reflexes. She exhibits normal muscle tone. Coordination normal.  Skin: Skin is warm and dry.  Psychiatric: She has a normal mood and affect. Her behavior is normal. Judgment and thought content normal.  Breast/pelvic: Deferred          Assessment & Plan:  Preventative care- discussed importance of exercise. She has lost some weight and she attributes this to muscle loss.  Advised her to continue to monitor her weight and let me know if she has any further weight loss.  Shingrix #1 today.  Other immunizations are up to date.  Mammo up to date. Due for colo and an order has been placed.    Menopause syndrome- maintained on estrogen patch- requesting refills. Refills have been sent.  Hx of factor V leiden heterozygote-  Will refer to hematology. I have advised her to add aspirin 81mg  once daily and will see what hematology thinks about safety of ongoing HRT and if they have any other prophylaxis recommendations.    This visit occurred during the SARS-CoV-2 public health emergency.  Safety protocols were in place, including screening questions prior to the visit, additional usage of staff PPE, and extensive cleaning of exam room while observing appropriate contact time as indicated for disinfecting solutions.           Assessment & Plan:

## 2020-08-08 ENCOUNTER — Telehealth: Payer: Self-pay | Admitting: Family

## 2020-08-08 LAB — CBC WITH DIFFERENTIAL/PLATELET
Absolute Monocytes: 347 cells/uL (ref 200–950)
Basophils Absolute: 51 cells/uL (ref 0–200)
Basophils Relative: 1 %
Eosinophils Absolute: 51 cells/uL (ref 15–500)
Eosinophils Relative: 1 %
HCT: 45 % (ref 35.0–45.0)
Hemoglobin: 15.7 g/dL — ABNORMAL HIGH (ref 11.7–15.5)
Lymphs Abs: 1489 cells/uL (ref 850–3900)
MCH: 33.8 pg — ABNORMAL HIGH (ref 27.0–33.0)
MCHC: 34.9 g/dL (ref 32.0–36.0)
MCV: 97 fL (ref 80.0–100.0)
MPV: 11.8 fL (ref 7.5–12.5)
Monocytes Relative: 6.8 %
Neutro Abs: 3162 cells/uL (ref 1500–7800)
Neutrophils Relative %: 62 %
Platelets: 180 10*3/uL (ref 140–400)
RBC: 4.64 10*6/uL (ref 3.80–5.10)
RDW: 12.1 % (ref 11.0–15.0)
Total Lymphocyte: 29.2 %
WBC: 5.1 10*3/uL (ref 3.8–10.8)

## 2020-08-08 LAB — HEPATIC FUNCTION PANEL
AG Ratio: 1.8 (calc) (ref 1.0–2.5)
ALT: 13 U/L (ref 6–29)
AST: 18 U/L (ref 10–35)
Albumin: 4.6 g/dL (ref 3.6–5.1)
Alkaline phosphatase (APISO): 46 U/L (ref 37–153)
Bilirubin, Direct: 0.1 mg/dL (ref 0.0–0.2)
Globulin: 2.6 g/dL (calc) (ref 1.9–3.7)
Indirect Bilirubin: 0.4 mg/dL (calc) (ref 0.2–1.2)
Total Bilirubin: 0.5 mg/dL (ref 0.2–1.2)
Total Protein: 7.2 g/dL (ref 6.1–8.1)

## 2020-08-08 LAB — LIPID PANEL
Cholesterol: 197 mg/dL (ref <200)
HDL: 91 mg/dL (ref >=50)
LDL Cholesterol (Calc): 87 mg/dL (calc)
Non-HDL Cholesterol (Calc): 106 mg/dL (calc) (ref <130)
Total CHOL/HDL Ratio: 2.2 (calc) (ref <5.0)
Triglycerides: 99 mg/dL (ref <150)

## 2020-08-08 LAB — BASIC METABOLIC PANEL
BUN: 13 mg/dL (ref 7–25)
CO2: 26 mmol/L (ref 20–32)
Calcium: 9.8 mg/dL (ref 8.6–10.4)
Chloride: 102 mmol/L (ref 98–110)
Creat: 0.84 mg/dL (ref 0.50–1.05)
Glucose, Bld: 84 mg/dL (ref 65–99)
Potassium: 4.5 mmol/L (ref 3.5–5.3)
Sodium: 138 mmol/L (ref 135–146)

## 2020-08-08 LAB — TSH: TSH: 2.37 mIU/L

## 2020-08-08 NOTE — Telephone Encounter (Signed)
lmom to inform patient of referral from PCP. Advised new patient appointment is 9/14 at 0830.

## 2020-08-14 ENCOUNTER — Encounter: Payer: Self-pay | Admitting: Gastroenterology

## 2020-08-21 ENCOUNTER — Ambulatory Visit (AMBULATORY_SURGERY_CENTER): Payer: Self-pay

## 2020-08-21 ENCOUNTER — Other Ambulatory Visit: Payer: Self-pay

## 2020-08-21 VITALS — Ht 65.0 in | Wt 112.8 lb

## 2020-08-21 DIAGNOSIS — Z1211 Encounter for screening for malignant neoplasm of colon: Secondary | ICD-10-CM

## 2020-08-21 NOTE — Progress Notes (Signed)
No allergies to soy or egg Pt is not on blood thinners or diet pills Denies issues with sedation/intubation Denies atrial flutter/fib Denies constipation    Pt is aware of Covid safety and care partner requirements.   After discussion of pills-pt decided chg to miralax prep.

## 2020-08-23 ENCOUNTER — Encounter: Payer: Self-pay | Admitting: Gastroenterology

## 2020-08-27 ENCOUNTER — Other Ambulatory Visit: Payer: Self-pay | Admitting: Family

## 2020-08-27 DIAGNOSIS — D6851 Activated protein C resistance: Secondary | ICD-10-CM

## 2020-08-28 ENCOUNTER — Other Ambulatory Visit: Payer: Self-pay

## 2020-08-28 ENCOUNTER — Inpatient Hospital Stay: Payer: 59 | Attending: Family

## 2020-08-28 ENCOUNTER — Inpatient Hospital Stay (HOSPITAL_BASED_OUTPATIENT_CLINIC_OR_DEPARTMENT_OTHER): Payer: 59 | Admitting: Family

## 2020-08-28 VITALS — BP 120/78 | HR 72 | Temp 98.6°F | Resp 16 | Ht 65.0 in | Wt 113.1 lb

## 2020-08-28 DIAGNOSIS — Z9079 Acquired absence of other genital organ(s): Secondary | ICD-10-CM | POA: Insufficient documentation

## 2020-08-28 DIAGNOSIS — Z7982 Long term (current) use of aspirin: Secondary | ICD-10-CM | POA: Insufficient documentation

## 2020-08-28 DIAGNOSIS — D6851 Activated protein C resistance: Secondary | ICD-10-CM

## 2020-08-28 DIAGNOSIS — Z801 Family history of malignant neoplasm of trachea, bronchus and lung: Secondary | ICD-10-CM | POA: Insufficient documentation

## 2020-08-28 DIAGNOSIS — Z8249 Family history of ischemic heart disease and other diseases of the circulatory system: Secondary | ICD-10-CM | POA: Diagnosis not present

## 2020-08-28 DIAGNOSIS — Z90710 Acquired absence of both cervix and uterus: Secondary | ICD-10-CM | POA: Diagnosis not present

## 2020-08-28 LAB — CMP (CANCER CENTER ONLY)
ALT: 12 U/L (ref 0–44)
AST: 16 U/L (ref 15–41)
Albumin: 4.2 g/dL (ref 3.5–5.0)
Alkaline Phosphatase: 41 U/L (ref 38–126)
Anion gap: 5 (ref 5–15)
BUN: 16 mg/dL (ref 6–20)
CO2: 33 mmol/L — ABNORMAL HIGH (ref 22–32)
Calcium: 9.7 mg/dL (ref 8.9–10.3)
Chloride: 103 mmol/L (ref 98–111)
Creatinine: 0.9 mg/dL (ref 0.44–1.00)
GFR, Est AFR Am: >60 mL/min (ref >60)
GFR, Estimated: >60 mL/min (ref >60)
Glucose, Bld: 97 mg/dL (ref 70–99)
Potassium: 4.2 mmol/L (ref 3.5–5.1)
Sodium: 141 mmol/L (ref 135–145)
Total Bilirubin: 0.7 mg/dL (ref 0.3–1.2)
Total Protein: 6.7 g/dL (ref 6.5–8.1)

## 2020-08-28 LAB — CBC WITH DIFFERENTIAL (CANCER CENTER ONLY)
Abs Immature Granulocytes: 0.01 10*3/uL (ref 0.00–0.07)
Basophils Absolute: 0 10*3/uL (ref 0.0–0.1)
Basophils Relative: 1 %
Eosinophils Absolute: 0.1 10*3/uL (ref 0.0–0.5)
Eosinophils Relative: 2 %
HCT: 43.2 % (ref 36.0–46.0)
Hemoglobin: 14.7 g/dL (ref 12.0–15.0)
Immature Granulocytes: 0 %
Lymphocytes Relative: 25 %
Lymphs Abs: 1.2 10*3/uL (ref 0.7–4.0)
MCH: 33.2 pg (ref 26.0–34.0)
MCHC: 34 g/dL (ref 30.0–36.0)
MCV: 97.5 fL (ref 80.0–100.0)
Monocytes Absolute: 0.5 10*3/uL (ref 0.1–1.0)
Monocytes Relative: 10 %
Neutro Abs: 2.9 10*3/uL (ref 1.7–7.7)
Neutrophils Relative %: 62 %
Platelet Count: 191 10*3/uL (ref 150–400)
RBC: 4.43 MIL/uL (ref 3.87–5.11)
RDW: 12 % (ref 11.5–15.5)
WBC Count: 4.7 10*3/uL (ref 4.0–10.5)
nRBC: 0 % (ref 0.0–0.2)

## 2020-08-28 LAB — LACTATE DEHYDROGENASE: LDH: 161 U/L (ref 98–192)

## 2020-08-28 NOTE — Progress Notes (Addendum)
Hematology/Oncology Consultation   Name: CECILLE MCCLUSKY      MRN: [id number]    Location: Room/bed info not found  Date: 08/28/2020 Time:8:55 AM   REFERRING PHYSICIAN: Debbrah Alar, NP  REASON FOR CONSULT: Heterozygous Factor V Leiden mutation   DIAGNOSIS: Heterozygous for the R506Q Factor V Leiden mutation  HISTORY OF PRESENT ILLNESS: Ms. Dreese is a very pleasant 54 yo female with a several year known history of having Factor V Leiden, heterozygous.  Her father had developed a DVT and then PE. Shortly after that her sister had a vein procedure on her leg and developed a DVT. Her sister was tested and found to have Factor V.  She has no personal history of thrombus.  She state that during her work up with gynecology the family history led them to have her tested as well and she was also a carrier.  Both of her sons have the same single mutation. She has no history of miscarriage.  No personal or familial history of stroke.  She has had multiple surgeries in the past without any complications.  She had a partial hysterectomy in 2016 and is currently on an estradiol patch. This was started because she was having severe hot flashes and night sweats and was unable to sleep.  No diabetes or history of thyroid disease. So far she has had no issues on this.  She also recently started taking a baby aspirin daily.  No fever, chills, n/v, cough, rash, dizziness, SOB, chest pain, palpitations, abdominal pain or changes in bowel or bladder habits.  No episodes of bleeding. No bruising or petechiae.  No swelling, tenderness, numbness or tingling in her extremities.  No falls or syncopal episodes to report.  She has maintained a good appetite but admits that she needs to better hydrate throughout the day. Her weight is stable.   She does not smoke. She has a glass of wine 1-2 times a week. No recreation drug use.  She is walking for exercise.  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  She has had both Moderna Covid vaccines.   ROS: All other 10 point review of systems is negative.   PAST MEDICAL HISTORY:   Past Medical History:  Diagnosis Date   Arthritis    self report   Factor V Leiden mutation ([place])    Heart murmur    MVP as child resolved.    ALLERGIES: Allergies  Allergen Reactions   Clindamycin/Lincomycin Diarrhea   Lactose Intolerance (Gi)       MEDICATIONS:  Current Outpatient Medications on File Prior to Visit  Medication Sig Dispense Refill   aspirin EC 81 MG tablet Take 1 tablet (81 mg total) by mouth daily. Swallow whole. 30 tablet 11   Calcium Carb-Cholecalciferol (CALCIUM 1000 + D PO) Take by mouth.     estradiol (VIVELLE-DOT) 0.0375 MG/24HR Place 1 patch onto the skin 2 (two) times a week. 24 patch 4   Probiotic Product (PROBIOTIC PO) Take by mouth.     No current facility-administered medications on file prior to visit.     PAST SURGICAL HISTORY Past Surgical History:  Procedure Laterality Date   ANTERIOR AND POSTERIOR REPAIR N/A 05/22/2015   Procedure: ANTERIOR (CYSTOCELE) AND POSTERIOR REPAIR (RECTOCELE);  Surgeon: Anastasio Auerbach, MD;  Location: [place] [facility];  Service: Gynecology;  Laterality: N/A;   BILATERAL SALPINGECTOMY Bilateral 05/22/2015   Procedure: BILATERAL SALPINGECTOMY;  Surgeon: Anastasio Auerbach, MD;  Location: [place] [facility];  Service: Gynecology;  Laterality: Bilateral;   HYSTEROSCOPY  04/2005   HYSTEROSCOPIC MYOMECTOMY   IUD REMOVAL  08/2009   side effect symptoms after 2 months placement   TONSILLECTOMY AND SINUS SURGERY  2006   VAGINAL HYSTERECTOMY N/A 05/22/2015   Procedure: HYSTERECTOMY VAGINAL;  Surgeon: Anastasio Auerbach, MD;  Location: [place] [facility];  Service: Gynecology;  Laterality: N/A; FOR UTERINE PROLAPSE    FAMILY HISTORY: Family History  Problem Relation Age of Onset   Cirrhosis Brother    Clotting disorder Father    Clotting disorder Sister    Hypertension Mother    Lung  cancer Paternal Grandfather    Colon polyps Neg Hx    Colon cancer Neg Hx    Esophageal cancer Neg Hx    Stomach cancer Neg Hx    Rectal cancer Neg Hx     SOCIAL HISTORY:  reports that she has never smoked. She has never used smokeless tobacco. She reports current alcohol use of about 1.0 standard drink of alcohol per week. She reports that she does not use drugs.  PERFORMANCE STATUS: The patient's performance status is 0 - Asymptomatic  PHYSICAL EXAM: Most Recent Vital Signs: Last menstrual period 04/20/2015. BP 120/78 (BP Location: Right Arm, Patient Position: Sitting)    Pulse 72    Temp 98.6 F (37 C) (Oral)    Resp 16    Ht 5\' 5"  (1.651 m)    Wt 113 lb 1.9 oz (51.3 kg)    LMP 04/20/2015    SpO2 100%    BMI 18.82 kg/m   General Appearance:    Alert, cooperative, no distress, appears stated age  Head:    Normocephalic, without obvious abnormality, atraumatic  Eyes:    PERRL, conjunctiva/corneas clear, EOM's intact, fundi    benign, both eyes        Throat:   Lips, mucosa, and tongue normal; teeth and gums normal  Neck:   Supple, symmetrical, trachea midline, no adenopathy;    thyroid:  no enlargement/tenderness/nodules; no carotid   bruit or JVD  Back:     Symmetric, no curvature, ROM normal, no CVA tenderness  Lungs:     Clear to auscultation bilaterally, respirations unlabored  Chest Wall:    No tenderness or deformity   Heart:    Regular rate and rhythm, S1 and S2 normal, no murmur, rub   or gallop     Abdomen:     Soft, non-tender, bowel sounds active all four quadrants,    no masses, no organomegaly        Extremities:   Extremities normal, atraumatic, no cyanosis or edema  Pulses:   2+ and symmetric all extremities  Skin:   Skin color, texture, turgor normal, no rashes or lesions  Lymph nodes:   Cervical, supraclavicular, and axillary nodes normal  Neurologic:   CNII-XII intact, normal strength, sensation and reflexes    throughout    LABORATORY DATA:   Results for orders placed or performed in visit on 08/28/20 (from the past 48 hour(s))  CBC with Differential ([facility] Only)     Status: None   Collection Time: 08/28/20  8:34 AM  Result Value Ref Range   WBC Count 4.7 4.0 - 10.5 K/uL   RBC 4.43 3.87 - 5.11 MIL/uL   Hemoglobin 14.7 12.0 - 15.0 g/dL   HCT 43.2 36 - 46 %   MCV 97.5 80.0 - 100.0 fL   MCH 33.2 26.0 - 34.0 pg  MCHC 34.0 30.0 - 36.0 g/dL   RDW 12.0 11.5 - 15.5 %   Platelet Count 191 150 - 400 K/uL   nRBC 0.0 0.0 - 0.2 %   Neutrophils Relative % 62 %   Neutro Abs 2.9 1.7 - 7.7 K/uL   Lymphocytes Relative 25 %   Lymphs Abs 1.2 0.7 - 4.0 K/uL   Monocytes Relative 10 %   Monocytes Absolute 0.5 0 - 1 K/uL   Eosinophils Relative 2 %   Eosinophils Absolute 0.1 0 - 0 K/uL   Basophils Relative 1 %   Basophils Absolute 0.0 0 - 0 K/uL   Immature Granulocytes 0 %   Abs Immature Granulocytes 0.01 0.00 - 0.07 K/uL    Comment: Performed at [facility] at [facility], [address], [place], [place] [address]      RADIOGRAPHY: No results found.     PATHOLOGY: None  ASSESSMENT/PLAN: Ms. Amick is a very pleasant 54 yo female with a several year known history of having Factor V Leiden, heterozygous for the R506Q mutation. There is some concern about whether or not she should use this with her history of Factor V.  She has no personal history of thrombotic event and has no history of miscarriage.  I spoke with Dr. Marin Olp and from our standpoint she is ok to stay on the patch since she remains unaffected and asymptomatic.  If she were to develop a thrombus in the future all estrogen replacement therapy will have to be discontinued.   All questions were answered and she is in agreement with the plan. She can contact our office with any questions or concerns. We can certainly see her again if needed.   She was discussed with Dr. Marin Olp and he is in agreement with the aforementioned.   Laverna Peace, NP

## 2020-08-31 LAB — FACTOR 5 LEIDEN

## 2020-09-20 ENCOUNTER — Ambulatory Visit (AMBULATORY_SURGERY_CENTER): Payer: 59 | Admitting: Gastroenterology

## 2020-09-20 ENCOUNTER — Encounter: Payer: Self-pay | Admitting: Gastroenterology

## 2020-09-20 ENCOUNTER — Other Ambulatory Visit: Payer: Self-pay

## 2020-09-20 VITALS — BP 108/72 | HR 60 | Temp 98.4°F | Resp 14 | Ht 65.0 in | Wt 112.0 lb

## 2020-09-20 DIAGNOSIS — K635 Polyp of colon: Secondary | ICD-10-CM

## 2020-09-20 DIAGNOSIS — D120 Benign neoplasm of cecum: Secondary | ICD-10-CM

## 2020-09-20 DIAGNOSIS — Z1211 Encounter for screening for malignant neoplasm of colon: Secondary | ICD-10-CM

## 2020-09-20 DIAGNOSIS — K640 First degree hemorrhoids: Secondary | ICD-10-CM

## 2020-09-20 MED ORDER — SODIUM CHLORIDE 0.9 % IV SOLN: 500.0000 mL | Freq: Once | INTRAVENOUS | Status: DC

## 2020-09-20 NOTE — Progress Notes (Signed)
Called to room to assist during endoscopic procedure.  Patient ID and intended procedure confirmed with present staff. Received instructions for my participation in the procedure from the performing physician.  

## 2020-09-20 NOTE — Op Note (Signed)
[name] Procedure Date: 09/20/2020 9:13 AM MRN: [id number] Endoscopist: Gerrit Heck , MD Age: 54 Referring MD:  Date of Birth: 1966/02/13 Gender: Female Account #: [id number] Procedure:                Colonoscopy Indications:              Screening for colorectal malignant neoplasm, This                            is the patient's first colonoscopy Medicines:                Monitored Anesthesia Care Procedure:                Pre-Anesthesia Assessment:                           - Prior to the procedure, a History and Physical                            was performed, and patient medications and                            allergies were reviewed. The patient's tolerance of                            previous anesthesia was also reviewed. The risks                            and benefits of the procedure and the sedation                            options and risks were discussed with the patient.                            All questions were answered, and informed consent                            was obtained. Prior Anticoagulants: The patient has                            taken no previous anticoagulant or antiplatelet                            agents. ASA Grade Assessment: II - A patient with                            mild systemic disease. After reviewing the risks                            and benefits, the patient was deemed in                            satisfactory condition to undergo the procedure.  After obtaining informed consent, the colonoscope                            was passed under direct vision. Throughout the                            procedure, the patient's blood pressure, pulse, and                            oxygen saturations were monitored continuously. The                            Colonoscope was introduced through the anus and                            advanced to the the cecum,  identified by                            appendiceal orifice and ileocecal valve. The                            colonoscopy was performed without difficulty. The                            patient tolerated the procedure well. The quality                            of the bowel preparation was good. The ileocecal                            valve, appendiceal orifice, and rectum were                            photographed. Scope In: 9:18:23 AM Scope Out: 9:39:32 AM Scope Withdrawal Time: 0 hours 14 minutes 58 seconds  Total Procedure Duration: 0 hours 21 minutes 9 seconds  Findings:                 The perianal and digital rectal examinations were                            normal.                           A 2 mm polyp was found in the cecum. The polyp was                            sessile. The polyp was removed with a cold biopsy                            forceps. Resection and retrieval were complete.                            Estimated blood loss was minimal.  Non-bleeding internal hemorrhoids were found during                            retroflexion. The hemorrhoids were small and Grade                            I (internal hemorrhoids that do not prolapse).                           The exam was otherwise normal throughout the                            remainder of the colon. Complications:            No immediate complications. Estimated Blood Loss:     Estimated blood loss was minimal. Impression:               - One 2 mm polyp in the cecum, removed with a cold                            biopsy forceps. Resected and retrieved.                           - Non-bleeding internal hemorrhoids. Recommendation:           - Patient has a contact number available for                            emergencies. The signs and symptoms of potential                            delayed complications were discussed with the                            patient. Return  to normal activities tomorrow.                            Written discharge instructions were provided to the                            patient.                           - Resume previous diet.                           - Continue present medications.                           - Await pathology results.                           - Repeat colonoscopy in 5-10 years for surveillance                            based on pathology results.                           -  Return to GI office PRN. Gerrit Heck, MD 09/20/2020 9:43:14 AM

## 2020-09-20 NOTE — Patient Instructions (Signed)
Handouts given for polyps and hemorrhoids. ? ?YOU HAD AN ENDOSCOPIC PROCEDURE TODAY AT THE [facility]:   Refer to the procedure report that was given to you for any specific questions about what was found during the examination.  If the procedure report does not answer your questions, please call your gastroenterologist to clarify.  If you requested that your care partner not be given the details of your procedure findings, then the procedure report has been included in a sealed envelope for you to review at your convenience later. ? ?YOU SHOULD EXPECT: Some feelings of bloating in the abdomen. Passage of more gas than usual.  Walking can help get rid of the air that was put into your GI tract during the procedure and reduce the bloating. If you had a lower endoscopy (such as a colonoscopy or flexible sigmoidoscopy) you may notice spotting of blood in your stool or on the toilet paper. If you underwent a bowel prep for your procedure, you may not have a normal bowel movement for a few days. ? ?Please Note:  You might notice some irritation and congestion in your nose or some drainage.  This is from the oxygen used during your procedure.  There is no need for concern and it should clear up in a day or so. ? ?SYMPTOMS TO REPORT IMMEDIATELY: ? ?Following lower endoscopy (colonoscopy or flexible sigmoidoscopy): ? Excessive amounts of blood in the stool ? Significant tenderness or worsening of abdominal pains ? Swelling of the abdomen that is new, acute ? Fever of 100?F or higher ? ?For urgent or emergent issues, a gastroenterologist can be reached at any hour by calling [phone]. ?Do not use MyChart messaging for urgent concerns.  ? ? ?DIET:  We do recommend a small meal at first, but then you may proceed to your regular diet.  Drink plenty of fluids but you should avoid alcoholic beverages for 24 hours. ? ?ACTIVITY:  You should plan to take it easy for the rest of today and you should NOT DRIVE or  use heavy machinery until tomorrow (because of the sedation medicines used during the test).   ? ?FOLLOW UP: ?Our staff will call the number listed on your records 48-72 hours following your procedure to check on you and address any questions or concerns that you may have regarding the information given to you following your procedure. If we do not reach you, we will leave a message.  We will attempt to reach you two times.  During this call, we will ask if you have developed any symptoms of COVID 19. If you develop any symptoms (ie: fever, flu-like symptoms, shortness of breath, cough etc.) before then, please call [phone].  If you test positive for Covid 19 in the 2 weeks post procedure, please call and report this information to us.   ? ?If any biopsies were taken you will be contacted by phone or by letter within the next 1-3 weeks.  Please call us at [phone] if you have not heard about the biopsies in 3 weeks.  ? ? ?SIGNATURES/CONFIDENTIALITY: ?You and/or your care partner have signed paperwork which will be entered into your electronic medical record.  These signatures attest to the fact that that the information above on your After Visit Summary has been reviewed and is understood.  Full responsibility of the confidentiality of this discharge information lies with you and/or your care-partner.  ?

## 2020-09-20 NOTE — Progress Notes (Signed)
To PACU, VSS. Report to [email or web address] 

## 2020-09-20 NOTE — Progress Notes (Signed)
Vital signs checked by:CW  The patient states no changes in medical or surgical history since pre-visit screening on 08/21/20.

## 2020-09-24 ENCOUNTER — Telehealth: Payer: Self-pay

## 2020-09-24 ENCOUNTER — Telehealth: Payer: Self-pay | Admitting: *Deleted

## 2020-09-24 NOTE — Telephone Encounter (Signed)
First attempt follow up call to pt, call did not go through.

## 2020-09-24 NOTE — Telephone Encounter (Signed)
No answer for post procedure call back. LEft message for patient to call with questions or concerns.

## 2020-10-01 ENCOUNTER — Encounter: Payer: Self-pay | Admitting: Gastroenterology

## 2020-10-09 ENCOUNTER — Ambulatory Visit: Payer: 59

## 2020-10-12 ENCOUNTER — Ambulatory Visit (INDEPENDENT_AMBULATORY_CARE_PROVIDER_SITE_OTHER): Payer: 59

## 2020-10-12 ENCOUNTER — Other Ambulatory Visit: Payer: Self-pay

## 2020-10-12 DIAGNOSIS — Z23 Encounter for immunization: Secondary | ICD-10-CM | POA: Diagnosis not present

## 2020-10-12 NOTE — Progress Notes (Signed)
Patient here for second shingrix vaccine. 0.5 mL given in left deltoid IM. Patient tolerated well. VIS given. [facility] updated.

## 2021-03-21 ENCOUNTER — Other Ambulatory Visit: Payer: Self-pay

## 2021-03-21 ENCOUNTER — Ambulatory Visit: Payer: Self-pay

## 2021-03-21 ENCOUNTER — Ambulatory Visit: Payer: 59 | Admitting: Family Medicine

## 2021-03-21 ENCOUNTER — Encounter: Payer: Self-pay | Admitting: Family Medicine

## 2021-03-21 VITALS — BP 112/80 | Ht 65.0 in | Wt 112.0 lb

## 2021-03-21 DIAGNOSIS — M19079 Primary osteoarthritis, unspecified ankle and foot: Secondary | ICD-10-CM | POA: Diagnosis not present

## 2021-03-21 DIAGNOSIS — S93492A Sprain of other ligament of left ankle, initial encounter: Secondary | ICD-10-CM | POA: Diagnosis not present

## 2021-03-21 MED ORDER — TRIAMCINOLONE ACETONIDE 40 MG/ML IJ SUSP
20.0000 mg | Freq: Once | INTRAMUSCULAR | Status: AC
  Administered 2021-03-21: 20 mg via INTRA_ARTICULAR

## 2021-03-21 NOTE — Progress Notes (Signed)
Medication Samples have been provided to the patient.  Drug name: Pennsaid       Strength: 2%       Qty: 2 boxes  LOT: [id number]  Exp.Date: 05/2022  Dosing instructions: use a pea sized amount on affected area twice daily.  The patient has been instructed regarding the correct time, dose, and frequency of taking this medication, including desired effects and most common side effects.   Jonathon Resides 10:12 AM 03/21/2021

## 2021-03-21 NOTE — Patient Instructions (Signed)
Nice to meet you Please try ice  Please try the exercises  Please try the pennsaid and let us know if you want more.   Please send me a message in MyChart with any questions or updates.  Please see me back in 4 weeks or as needed if better.   --Dr. Raeford Razor

## 2021-03-21 NOTE — Progress Notes (Signed)
LIGIA DUGUAY - 55 y.o. female MRN [id number]  Date of birth: 1966/05/24  SUBJECTIVE:  Including CC & ROS.  No chief complaint on file.   ANTOINE FIALLOS is a 55 y.o. female that is presenting with acute on chronic right toe pain and left ankle sprain.  She sprained her ankle about 4 weeks ago.  Still having some swelling and ecchymosis around the lateral malleolus.  Having acute right great toe pain.  Has received steroid injections in the past.   Review of Systems See HPI   HISTORY: Past Medical, Surgical, Social, and Family History Reviewed & Updated per EMR.   Pertinent Historical Findings include:  Past Medical History:  Diagnosis Date  . Arthritis    self report  . Factor V Leiden mutation ([place])   . Heart murmur    MVP as child resolved.    Past Surgical History:  Procedure Laterality Date  . ANTERIOR AND POSTERIOR REPAIR N/A 05/22/2015   Procedure: ANTERIOR (CYSTOCELE) AND POSTERIOR REPAIR (RECTOCELE);  Surgeon: Anastasio Auerbach, MD;  Location: [facility];  Service: Gynecology;  Laterality: N/A;  . BILATERAL SALPINGECTOMY Bilateral 05/22/2015   Procedure: BILATERAL SALPINGECTOMY;  Surgeon: Anastasio Auerbach, MD;  Location: [facility];  Service: Gynecology;  Laterality: Bilateral;  . HYSTEROSCOPY  04/2005   HYSTEROSCOPIC MYOMECTOMY  . IUD REMOVAL  08/2009   side effect symptoms after 2 months placement  . TONSILLECTOMY AND SINUS SURGERY  2006  . VAGINAL HYSTERECTOMY N/A 05/22/2015   Procedure: HYSTERECTOMY VAGINAL;  Surgeon: Anastasio Auerbach, MD;  Location: [facility];  Service: Gynecology;  Laterality: N/A; FOR UTERINE PROLAPSE    Family History  Problem Relation Age of Onset  . Cirrhosis Brother   . Clotting disorder Father   . Clotting disorder Sister   . Hypertension Mother   . Lung cancer Paternal Grandfather   . Colon polyps Neg Hx   . Colon cancer Neg Hx   . Esophageal cancer Neg Hx   . Stomach cancer Neg Hx   . Rectal cancer Neg Hx     Social History    Socioeconomic History  . Marital status: Married    Spouse name: Not on file  . Number of children: Not on file  . Years of education: Not on file  . Highest education level: Not on file  Occupational History  . Not on file  Tobacco Use  . Smoking status: Never Smoker  . Smokeless tobacco: Never Used  Vaping Use  . Vaping Use: Never used  Substance and Sexual Activity  . Alcohol use: Yes    Alcohol/week: 1.0 standard drink    Types: 1 Glasses of wine per week    Comment: 3 times a week  . Drug use: No  . Sexual activity: Yes    Partners: Male    Birth control/protection: Other-see comments, None    Comment: Vasectomy-1st intercourse 55 yo--Fewer than 5 partners  Other Topics Concern  . 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   Enjoys crafts (painting, cross stitch, reading)   2 cats   Married   Completed HS   Social Determinants of Radio broadcast assistant Strain: Not on [employer] Insecurity: Not on file  Transportation Needs: Not on file  Physical Activity: Not on file  Stress: Not on file  Social Connections: Not on file  Intimate Partner Violence: Not on file     PHYSICAL EXAM:  VS: BP 112/80 (BP Location: Left Arm, Patient Position: Sitting, Cuff Size: Normal)   Ht 5\' 5"  (1.651 m)   Wt 112 lb (50.8 kg)   LMP 04/20/2015   BMI 18.64 kg/m  Physical Exam Gen: NAD, alert, cooperative with exam, well-appearing MSK:  Left ankle:  Swelling and ecchymosis around lateral malleolus. Tenderness palpation of the ATFL. Some ecchymosis down the toes. Neurovascularly intact   Aspiration/Injection Procedure Note AOIFE BOLD 11/12/1966  Procedure: Injection Indications: Right great toe pain  Procedure Details Consent: Risks of procedure as well as the alternatives and risks of each were explained to the (patient/caregiver).  Consent for procedure obtained. Time Out:  Verified patient identification, verified procedure, site/side was marked, verified correct patient position, special equipment/implants available, medications/allergies/relevent history reviewed, required imaging and test results available.  Performed.  The area was cleaned with iodine and alcohol swabs.    The right first MTP joint was injected using 0.5 cc's of 40 mg Kenalog and 0.5 cc's of 0.25% bupivacaine with a 25 1 1/2" needle.  Ultrasound was used. Images were obtained in short views showing the injection.     A sterile dressing was applied.  Patient did tolerate procedure well.     ASSESSMENT & PLAN:   Arthritis of first MTP joint Acute on chronic in nature.  Has tried medications and insoles. -Counseled on home exercise therapy and supportive care. -Injection. -Pennsaid samples.   Sprain of anterior talofibular ligament of left ankle Has changes consistent with ankle sprain. -Counseled on home exercise therapy and supportive care. -Could consider imaging or physical therapy.

## 2021-03-21 NOTE — Assessment & Plan Note (Signed)
Has changes consistent with ankle sprain. -Counseled on home exercise therapy and supportive care. -Could consider imaging or physical therapy.

## 2021-03-21 NOTE — Assessment & Plan Note (Addendum)
Acute on chronic in nature.  Has tried medications and insoles. -Counseled on home exercise therapy and supportive care. -Injection. -Pennsaid samples.

## 2021-08-07 ENCOUNTER — Encounter: Payer: 59 | Admitting: Family

## 2021-09-02 ENCOUNTER — Ambulatory Visit (INDEPENDENT_AMBULATORY_CARE_PROVIDER_SITE_OTHER): Payer: 59 | Admitting: Family

## 2021-09-02 ENCOUNTER — Other Ambulatory Visit: Payer: Self-pay

## 2021-09-02 ENCOUNTER — Encounter: Payer: Self-pay | Admitting: Family

## 2021-09-02 VITALS — BP 115/85 | HR 62 | Temp 98.2°F | Resp 16 | Ht 65.0 in | Wt 117.0 lb

## 2021-09-02 DIAGNOSIS — Z0000 Encounter for general adult medical examination without abnormal findings: Secondary | ICD-10-CM | POA: Diagnosis not present

## 2021-09-02 DIAGNOSIS — Z23 Encounter for immunization: Secondary | ICD-10-CM | POA: Diagnosis not present

## 2021-09-02 NOTE — Assessment & Plan Note (Signed)
Encouraged pt to continue healthy diet and regular exercise. Pap, colo, tetanus up to date. mammo due.  Order placed. Flu shot today. Recommended bivalent covid booster.

## 2021-09-02 NOTE — Progress Notes (Signed)
Subjective:   By signing my name below, I, Shehryar Baig, attest that this documentation has been prepared under the direction and in the presence of Debbrah Alar NP. 09/02/2021    Patient ID: Colleen Gardner, female    DOB: [birth year], 55 y.o.   MRN: [name]  Chief Complaint  Patient presents with   Annual Exam    HPI Patient is in today for a comprehensive physical exam.   She denies having any unexpected weight change, ear pain, hearing loss and rhinorrhea, visual disturbance, cough, chest pain and leg swelling, nausea, vomiting, diarrhea and blood in stool, or dysuria and frequency, for myalgias and arthralgias, rash, headaches, adenopathy, depression or anxiety at this time.  Estrogen- She has seen a hematologist specialist and was told she is fine to continue taking estrogen (she is a heterozygote for factor V leiden mutation).  She also continues taking 81 mg aspirin daily PO to prevent clots regularly and reports no new issues while taking it.   Cholesterol- Her cholesterol was doing well during her last lab work.   Lab Results  Component Value Date   CHOL 197 08/07/2020   HDL 91 08/07/2020   LDLCALC 87 08/07/2020   TRIG 99 08/07/2020   CHOLHDL 2.2 08/07/2020   Thyroid- Her thyroid levels were doing well during her last lab work.  Social history- She has no recent surgical procedures. She as no recent changes to her family medical history. She does not use tobacco products. She does not drink alcohol. She does not use drugs.   Immunizations: She is not interested in HIV or hepatitis C screening at this time. She is UTD on flu vaccines at this time.  Diet: She is managing a healthy diet at this time.  Exercise: She participates in regular exercise by walking.  Colonoscopy: Last completed 09/20/2020. Results showed one 2 mm polyp in cecum which was removed, non-bleeding internal hemorrhoids. Otherwise results are normal. Repeat in 5-10 years.  Pap Smear: Last  completed 07/11/2019. Results are normal. Repeat in 1 year.  Mammogram: Last completed 05/03/2020. Results are normal. Repeat in one year. Due. She is interested in setting up an appointment.  Dental: She is UTD on dental care. She has an upcoming appointment this week. Vision: She is UTD on vision care. She has an upcomming appointment in November, 2022.    Health Maintenance Due  Topic Date Due   COVID-19 Vaccine (3 - Booster for Moderna series) 08/22/2020    Past Medical History:  Diagnosis Date   Arthritis    self report   Factor V Leiden mutation ([place])    Heart murmur    MVP as child resolved.    Past Surgical History:  Procedure Laterality Date   ANTERIOR AND POSTERIOR REPAIR N/A 05/22/2015   Procedure: ANTERIOR (CYSTOCELE) AND POSTERIOR REPAIR (RECTOCELE);  Surgeon: Anastasio Auerbach, MD;  Location: [facility];  Service: Gynecology;  Laterality: N/A;   BILATERAL SALPINGECTOMY Bilateral 05/22/2015   Procedure: BILATERAL SALPINGECTOMY;  Surgeon: Anastasio Auerbach, MD;  Location: [facility];  Service: Gynecology;  Laterality: Bilateral;   HYSTEROSCOPY  04/2005   HYSTEROSCOPIC MYOMECTOMY   IUD REMOVAL  08/2009   side effect symptoms after 2 months placement   TONSILLECTOMY AND SINUS SURGERY  2006   VAGINAL HYSTERECTOMY N/A 05/22/2015   Procedure: HYSTERECTOMY VAGINAL;  Surgeon: Anastasio Auerbach, MD;  Location: [facility];  Service: Gynecology;  Laterality: N/A; FOR UTERINE PROLAPSE    Family History  Problem Relation  Age of Onset   Cirrhosis Brother    Clotting disorder Father    Clotting disorder Sister    Hypertension Mother    Lung cancer Paternal Grandfather    Colon polyps Neg Hx    Colon cancer Neg Hx    Esophageal cancer Neg Hx    Stomach cancer Neg Hx    Rectal cancer Neg Hx     Social History   Socioeconomic History   Marital status: Married    Spouse name: Not on file   Number of children: Not on file   Years of education: Not on file   Highest education level:  Not on file  Occupational History   Not on file  Tobacco Use   Smoking status: Never   Smokeless tobacco: Never  Vaping Use   Vaping Use: Never used  Substance and Sexual Activity   Alcohol use: Yes    Alcohol/week: 1.0 standard drink    Types: 1 Glasses of wine per week    Comment: 3 times a week   Drug use: No   Sexual activity: Yes    Partners: Male    Birth control/protection: Other-see comments, None    Comment: Vasectomy-1st intercourse 55 yo--Fewer than 5 partners  Other Topics Concern   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 Swallow whole. 30 tablet 11   Calcium Carb-Cholecalciferol (CALCIUM 1000 + D PO) Take by mouth.     estradiol (VIVELLE-DOT) 0.0375 MG/24HR Place 1 patch onto the skin 2 (two) times a week. 24 patch 4   Probiotic Product (PROBIOTIC PO) Take by mouth.     No facility-administered medications prior to visit.    Allergies  Allergen Reactions   Clindamycin/Lincomycin Diarrhea   Lactose Intolerance (Gi)     Review of Systems  Constitutional:  Negative for weight loss.       (-)unexpected weight change (-)Adenopathy  HENT:  Negative for congestion and hearing loss.        (-)Rhinorrhea   Eyes:        (-)Visual disturbance  Respiratory:  Negative for cough.   Cardiovascular:  Negative for leg swelling.  Gastrointestinal:  Negative for blood in stool, constipation,  diarrhea, nausea and vomiting.  Genitourinary:  Negative for dysuria and frequency.  Neurological:  Negative for headaches.  Psychiatric/Behavioral:  Negative for depression. The patient is not nervous/anxious.       Objective:    Physical Exam Constitutional:      General: She is not in acute distress.    Appearance: Normal appearance. She is not ill-appearing.  HENT:     Head: Normocephalic and atraumatic.     Right Ear: Tympanic membrane, ear canal and external ear normal.     Left Ear: Tympanic membrane, ear canal and external ear normal.  Eyes:  Extraocular Movements: Extraocular movements intact.     Pupils: Pupils are equal, round, and reactive to light.     Comments: No nystagmus  Cardiovascular:     Rate and Rhythm: Normal rate and regular rhythm.     Heart sounds: Normal heart sounds. No murmur heard.   No gallop.  Pulmonary:     Effort: Pulmonary effort is normal. No respiratory distress.     Breath sounds: Normal breath sounds. No wheezing or rales.  Chest:  Breasts:    Right: Normal. No mass.     Left: Normal. No mass.  Abdominal:     General: There is no distension.     Palpations: Abdomen is soft.     Tenderness: There is no abdominal tenderness. There is no guarding.  Musculoskeletal:     Comments: 5/5 strength in both upper and lower extremities  Skin:    General: Skin is warm and dry.  Neurological:     Mental Status: She is alert and oriented to person, place, and time.     Deep Tendon Reflexes:     Reflex Scores:      Patellar reflexes are 2+ on the right side and 2+ on the left side. Psychiatric:        Behavior: Behavior normal.        Judgment: Judgment normal.    BP 115/85 (BP Location: Right Arm, Patient Position: Sitting, Cuff Size: Small)   Pulse 62   Temp 98.2 F (36.8 C) (Oral)   Resp 16   Ht '5\' 5"'$  (1.651 m)   Wt 117 lb (53.1 kg)   LMP 04/20/2015   SpO2 100%   BMI 19.47 kg/m  Wt Readings from Last 3 Encounters:  09/02/21 117  lb (53.1 kg)  03/21/21 112 lb (50.8 kg)  09/20/20 112 lb (50.8 kg)       Assessment & Plan:   Problem List Items Addressed This Visit       Unprioritized   Preventative health care    Encouraged pt to continue healthy diet and regular exercise. Pap, colo, tetanus up to date. mammo due.  Order placed. Flu shot today. Recommended bivalent covid booster.       Relevant Orders   MM 3D SCREEN BREAST BILATERAL   Other Visit Diagnoses     Needs flu shot    -  Primary   Relevant Orders   Flu Vaccine QUAD 6+ mos PF IM (Fluarix Quad PF) (Completed)        No orders of the defined types were placed in this encounter.   I, Debbrah Alar NP, personally preformed the services described in this documentation.  All medical record entries made by the scribe were at my direction and in my presence.  I have reviewed the chart and discharge instructions (if applicable) and agree that the record reflects my personal performance and is accurate and complete. 09/02/2021   I,Shehryar Baig,acting as a Education administrator for Nance Pear, NP.,have documented all relevant documentation on the behalf of Nance Pear, NP,as directed by  Nance Pear, NP while in the presence of Nance Pear, NP.   Nance Pear, NP

## 2021-09-24 ENCOUNTER — Encounter (HOSPITAL_BASED_OUTPATIENT_CLINIC_OR_DEPARTMENT_OTHER): Payer: Self-pay

## 2021-09-24 ENCOUNTER — Other Ambulatory Visit: Payer: Self-pay

## 2021-09-24 ENCOUNTER — Ambulatory Visit (HOSPITAL_BASED_OUTPATIENT_CLINIC_OR_DEPARTMENT_OTHER)
Admission: RE | Admit: 2021-09-24 | Discharge: 2021-09-24 | Disposition: A | Discharge status: Home / Self Care | Payer: 59 | Source: Ambulatory Visit | Attending: Family | Admitting: Family

## 2021-09-24 DIAGNOSIS — Z1231 Encounter for screening mammogram for malignant neoplasm of breast: Secondary | ICD-10-CM | POA: Diagnosis not present

## 2021-09-24 DIAGNOSIS — Z0000 Encounter for general adult medical examination without abnormal findings: Secondary | ICD-10-CM | POA: Insufficient documentation

## 2021-10-11 ENCOUNTER — Other Ambulatory Visit: Payer: Self-pay

## 2021-10-11 MED ORDER — ESTRADIOL 0.0375 MG/24HR TD PTTW: 1.0000 | MEDICATED_PATCH | TRANSDERMAL | 4 refills | Status: DC

## 2021-10-14 IMAGING — MG MM DIGITAL SCREENING BILAT W/ TOMO AND CAD
8 series · 9 of 24 positions shown · non-contrast
Comparison: Previous exam(s).

CLINICAL DATA: Screening.

EXAM:
DIGITAL SCREENING BILATERAL MAMMOGRAM WITH TOMOSYNTHESIS AND CAD
TECHNIQUE: Bilateral screening digital craniocaudal and mediolateral oblique
mammograms were obtained. Bilateral screening digital breast
tomosynthesis was performed. The images were evaluated with
computer-aided detection.

[L CC synth-2D]
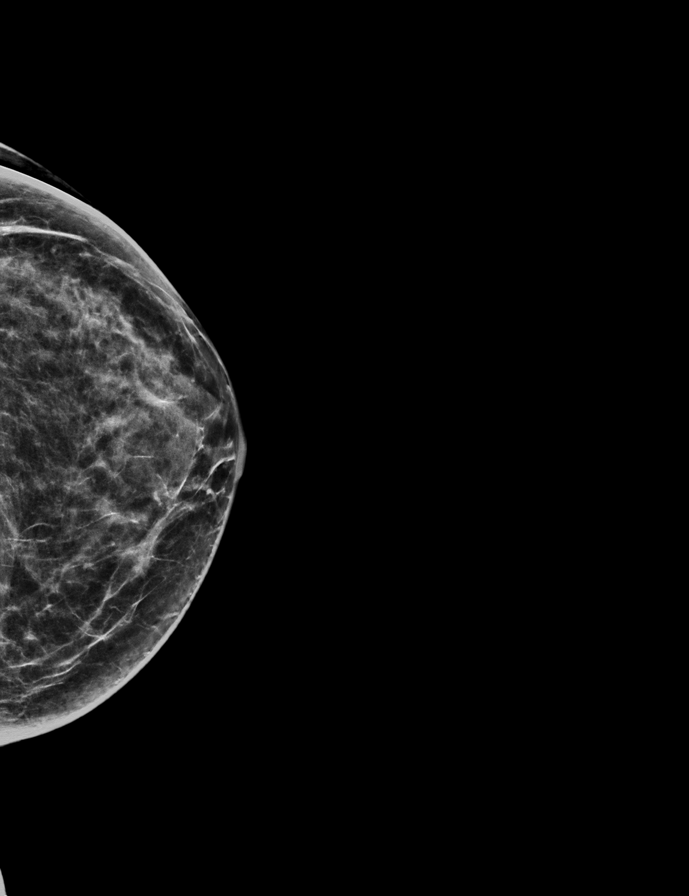

[L MLO synth-2D]
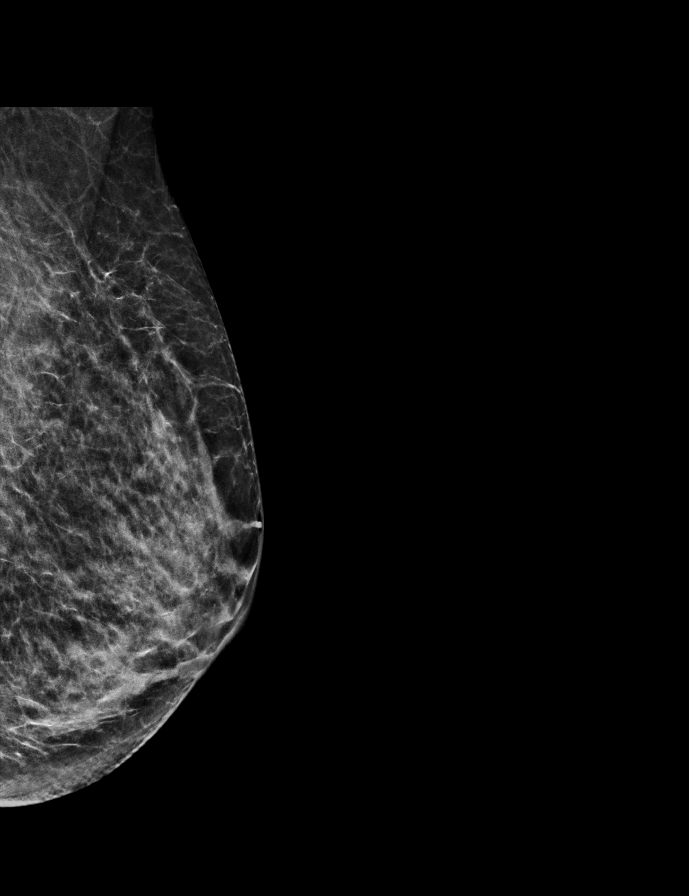

[R CC synth-2D]
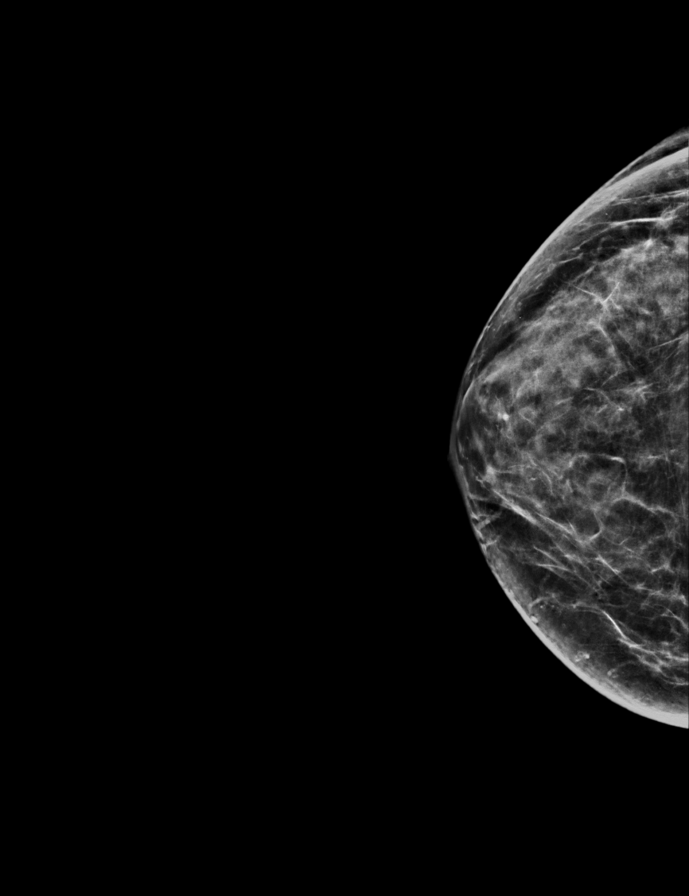

[R MLO synth-2D]
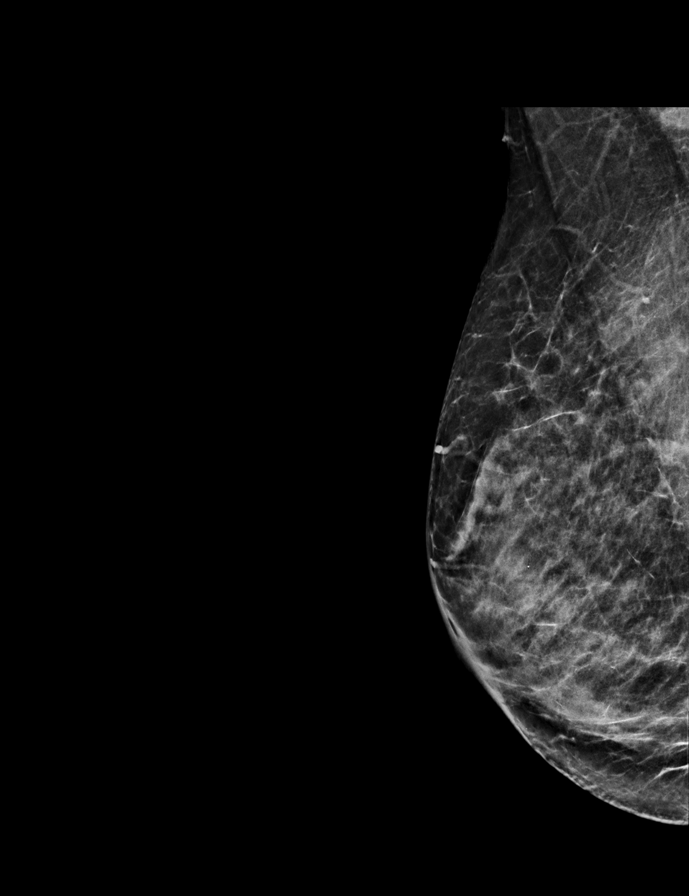

[R CC tomo · 2 of 58 frames shown]
[frame 19/58]
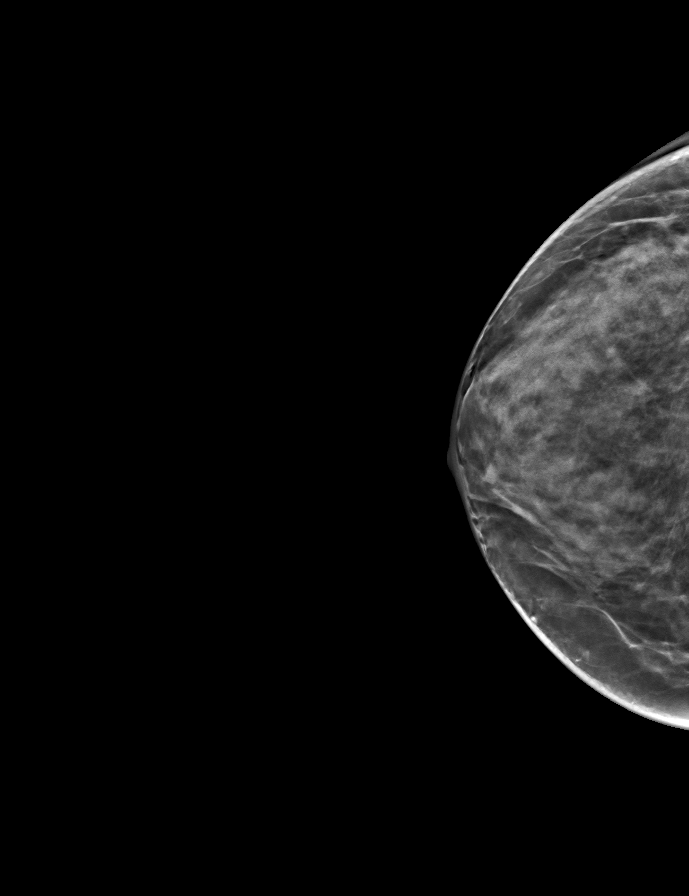
[frame 29/58]
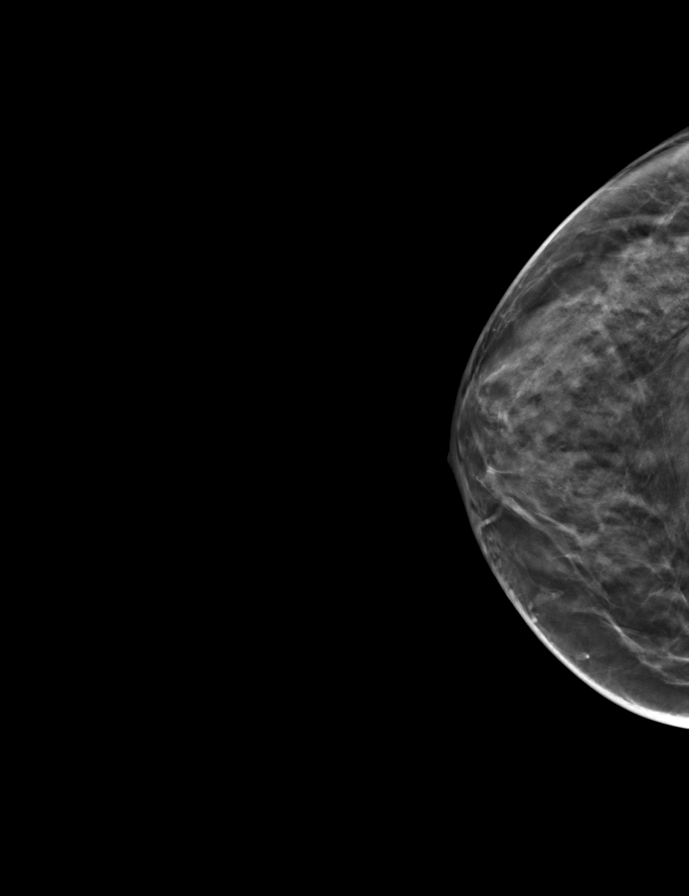

[R MLO tomo · tomo slice 27/54.0]
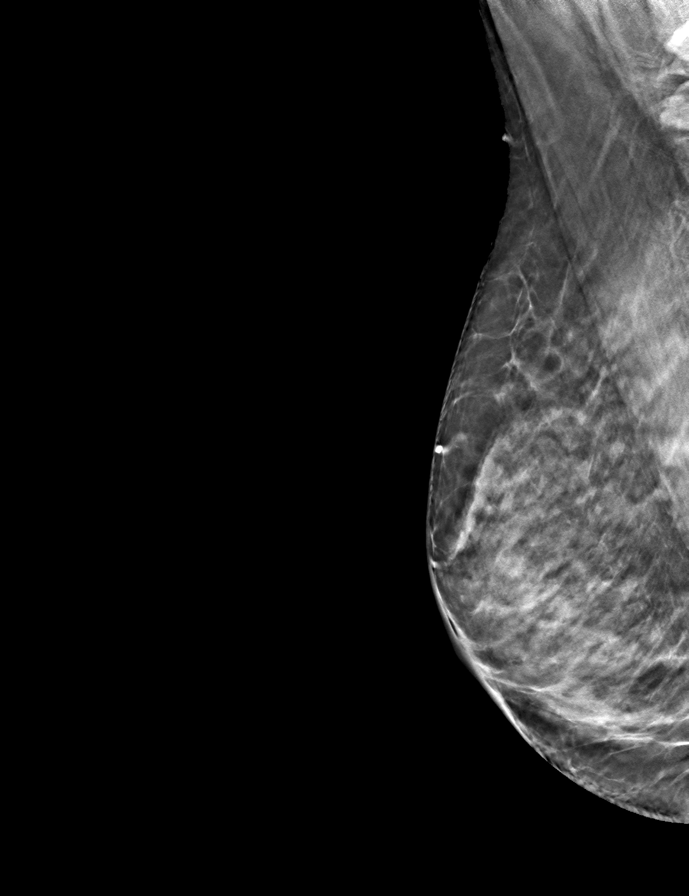

[L CC tomo · tomo slice 30/59.0]
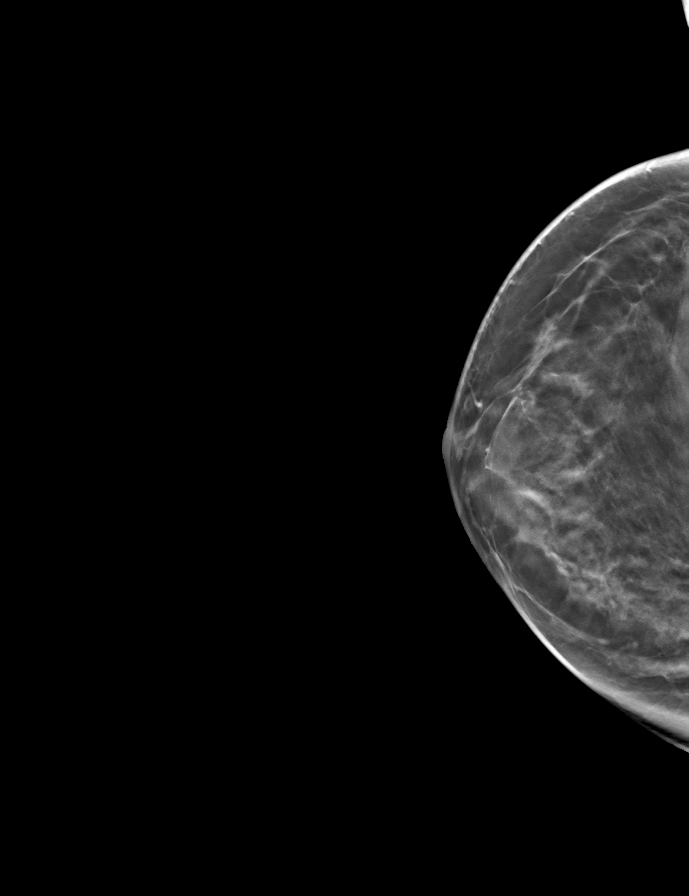

[L MLO tomo · tomo slice 27/53.0]
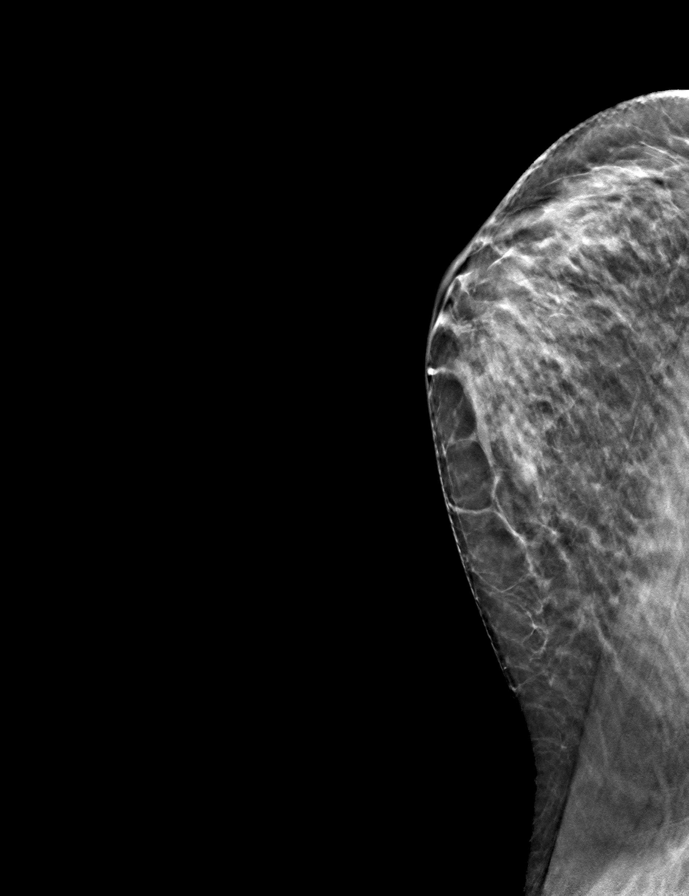

[9 of 24 positions shown; findings below may reference images not displayed]

ACR Breast Density Category c: The breast tissue is heterogeneously
dense, which may obscure small masses.
FINDINGS: There are no findings suspicious for malignancy.
IMPRESSION: No mammographic evidence of malignancy. A result letter of this
screening mammogram will be mailed directly to the patient.

RECOMMENDATION:
Screening mammogram in one year. (Code:Q3-W-BC3)

BI-RADS CATEGORY  1: Negative.

## 2022-04-11 ENCOUNTER — Encounter: Payer: Self-pay | Admitting: Family

## 2022-04-11 MED ORDER — ESTRADIOL 0.025 MG/24HR TD PTTW: 1.0000 | MEDICATED_PATCH | TRANSDERMAL | 2 refills | Status: DC

## 2022-08-19 ENCOUNTER — Other Ambulatory Visit (HOSPITAL_BASED_OUTPATIENT_CLINIC_OR_DEPARTMENT_OTHER): Payer: Self-pay | Admitting: Family

## 2022-08-19 DIAGNOSIS — Z1231 Encounter for screening mammogram for malignant neoplasm of breast: Secondary | ICD-10-CM

## 2022-08-29 ENCOUNTER — Ambulatory Visit (INDEPENDENT_AMBULATORY_CARE_PROVIDER_SITE_OTHER): Payer: No Typology Code available for payment source | Admitting: Family Medicine

## 2022-08-29 ENCOUNTER — Encounter: Payer: Self-pay | Admitting: Family Medicine

## 2022-08-29 ENCOUNTER — Ambulatory Visit: Payer: Self-pay

## 2022-08-29 VITALS — BP 102/70 | Ht 65.0 in | Wt 117.0 lb

## 2022-08-29 DIAGNOSIS — M19079 Primary osteoarthritis, unspecified ankle and foot: Secondary | ICD-10-CM | POA: Diagnosis not present

## 2022-08-29 MED ORDER — TRIAMCINOLONE ACETONIDE 40 MG/ML IJ SUSP
20.0000 mg | Freq: Once | INTRAMUSCULAR | Status: AC
  Administered 2022-08-29: 20 mg via INTRA_ARTICULAR

## 2022-08-29 NOTE — Patient Instructions (Signed)
Good to see you Please use ice   Please send me a message in MyChart with any questions or updates.  Please see me back as needed.   --Dr. Raeford Razor

## 2022-08-29 NOTE — Progress Notes (Signed)
  Colleen Gardner - 56 y.o. female MRN [id number]  Date of birth: [birth year]  SUBJECTIVE:  Including CC & ROS.  No chief complaint on file.   Colleen Gardner is a 56 y.o. female that is  presenting with acute great toe pain. Similar to her previous pain. Has done well with prior injections.    Review of Systems See HPI   HISTORY: Past Medical, Surgical, Social, and Family History Reviewed & Updated per EMR.   Pertinent Historical Findings include:  Past Medical History:  Diagnosis Date   Arthritis    self report   Factor V Leiden mutation ([place])    Heart murmur    MVP as child resolved.    Past Surgical History:  Procedure Laterality Date   ANTERIOR AND POSTERIOR REPAIR N/A 05/22/2015   Procedure: ANTERIOR (CYSTOCELE) AND POSTERIOR REPAIR (RECTOCELE);  Surgeon: Anastasio Auerbach, MD;  Location: [facility];  Service: Gynecology;  Laterality: N/A;   BILATERAL SALPINGECTOMY Bilateral 05/22/2015   Procedure: BILATERAL SALPINGECTOMY;  Surgeon: Anastasio Auerbach, MD;  Location: [facility];  Service: Gynecology;  Laterality: Bilateral;   HYSTEROSCOPY  04/2005   HYSTEROSCOPIC MYOMECTOMY   IUD REMOVAL  08/2009   side effect symptoms after 2 months placement   TONSILLECTOMY AND SINUS SURGERY  2006   VAGINAL HYSTERECTOMY N/A 05/22/2015   Procedure: HYSTERECTOMY VAGINAL;  Surgeon: Anastasio Auerbach, MD;  Location: [facility];  Service: Gynecology;  Laterality: N/A; FOR UTERINE PROLAPSE     PHYSICAL EXAM:  VS: BP 102/70 (BP Location: Left Arm, Patient Position: Sitting)   Ht '5\' 5"'$  (1.651 m)   Wt 117 lb (53.1 kg)   LMP 04/20/2015   BMI 19.47 kg/m  Physical Exam Gen: NAD, alert, cooperative with exam, well-appearing MSK:  Neurovascularly intact     Aspiration/Injection Procedure Note ALIA PARSLEY 1966-05-05  Procedure: Injection Indications: right great toe pain  Procedure Details Consent: Risks of procedure as well as the alternatives and risks of each were explained to the  (patient/caregiver).  Consent for procedure obtained. Time Out: Verified patient identification, verified procedure, site/side was marked, verified correct patient position, special equipment/implants available, medications/allergies/relevent history reviewed, required imaging and test results available.  Performed.  The area was cleaned with iodine and alcohol swabs.    The right first MTP joint was injected using 0.5 cc's of 40 mg Kenalog and 0.5 cc's of 0.25% bupivacaine with a 25 1 1/2" needle.  Ultrasound was used. Images were obtained in short views showing the injection.     A sterile dressing was applied.  Patient did tolerate procedure well.     ASSESSMENT & PLAN:   Arthritis of first MTP joint Acutely occurring. Similar to previous pain. Has done well with prior injections. Has tried insoles and orthotics in the past - counseled on home exercise therapy and supportive care  - injection today.

## 2022-08-29 NOTE — Assessment & Plan Note (Signed)
Acutely occurring. Similar to previous pain. Has done well with prior injections. Has tried insoles and orthotics in the past - counseled on home exercise therapy and supportive care  - injection today.

## 2022-09-03 ENCOUNTER — Encounter: Payer: Self-pay | Admitting: Family

## 2022-09-03 ENCOUNTER — Ambulatory Visit (INDEPENDENT_AMBULATORY_CARE_PROVIDER_SITE_OTHER): Payer: No Typology Code available for payment source | Admitting: Family

## 2022-09-03 VITALS — BP 120/79 | HR 66 | Temp 98.2°F | Resp 16 | Ht 65.5 in | Wt 121.0 lb

## 2022-09-03 DIAGNOSIS — Z01411 Encounter for gynecological examination (general) (routine) with abnormal findings: Secondary | ICD-10-CM | POA: Insufficient documentation

## 2022-09-03 DIAGNOSIS — D6851 Activated protein C resistance: Secondary | ICD-10-CM

## 2022-09-03 DIAGNOSIS — Z01419 Encounter for gynecological examination (general) (routine) without abnormal findings: Secondary | ICD-10-CM

## 2022-09-03 DIAGNOSIS — Z0001 Encounter for general adult medical examination with abnormal findings: Secondary | ICD-10-CM

## 2022-09-03 DIAGNOSIS — Z23 Encounter for immunization: Secondary | ICD-10-CM

## 2022-09-03 DIAGNOSIS — R002 Palpitations: Secondary | ICD-10-CM

## 2022-09-03 DIAGNOSIS — N9489 Other specified conditions associated with female genital organs and menstrual cycle: Secondary | ICD-10-CM

## 2022-09-03 DIAGNOSIS — Z7184 Encounter for health counseling related to travel: Secondary | ICD-10-CM | POA: Diagnosis not present

## 2022-09-03 LAB — CBC WITH DIFFERENTIAL/PLATELET
Basophils Absolute: 0 10*3/uL (ref 0.0–0.1)
Basophils Relative: 0.7 % (ref 0.0–3.0)
Eosinophils Absolute: 0 10*3/uL (ref 0.0–0.7)
Eosinophils Relative: 0.8 % (ref 0.0–5.0)
HCT: 44.4 % (ref 36.0–46.0)
Hemoglobin: 14.9 g/dL (ref 12.0–15.0)
Lymphocytes Relative: 18.5 % (ref 12.0–46.0)
Lymphs Abs: 1.1 10*3/uL (ref 0.7–4.0)
MCHC: 33.6 g/dL (ref 30.0–36.0)
MCV: 98.9 fl (ref 78.0–100.0)
Monocytes Absolute: 0.6 10*3/uL (ref 0.1–1.0)
Monocytes Relative: 9.7 % (ref 3.0–12.0)
Neutro Abs: 4.1 10*3/uL (ref 1.4–7.7)
Neutrophils Relative %: 70.3 % (ref 43.0–77.0)
Platelets: 191 10*3/uL (ref 150.0–400.0)
RBC: 4.49 Mil/uL (ref 3.87–5.11)
RDW: 12.9 % (ref 11.5–15.5)
WBC: 5.8 10*3/uL (ref 4.0–10.5)

## 2022-09-03 LAB — COMPREHENSIVE METABOLIC PANEL
ALT: 13 U/L (ref 0–35)
AST: 16 U/L (ref 0–37)
Albumin: 4.4 g/dL (ref 3.5–5.2)
Alkaline Phosphatase: 50 U/L (ref 39–117)
BUN: 16 mg/dL (ref 6–23)
CO2: 30 mEq/L (ref 19–32)
Calcium: 9.5 mg/dL (ref 8.4–10.5)
Chloride: 101 mEq/L (ref 96–112)
Creatinine, Ser: 0.92 mg/dL (ref 0.40–1.20)
GFR: 70.88 mL/min (ref >60.00)
Glucose, Bld: 84 mg/dL (ref 70–99)
Potassium: 4.2 mEq/L (ref 3.5–5.1)
Sodium: 139 mEq/L (ref 135–145)
Total Bilirubin: 0.8 mg/dL (ref 0.2–1.2)
Total Protein: 7.2 g/dL (ref 6.0–8.3)

## 2022-09-03 LAB — LIPID PANEL
Cholesterol: 194 mg/dL (ref 0–200)
HDL: 92.4 mg/dL (ref >39.00)
LDL Cholesterol: 86 mg/dL (ref 0–99)
NonHDL: 101.3
Total CHOL/HDL Ratio: 2
Triglycerides: 77 mg/dL (ref 0.0–149.0)
VLDL: 15.4 mg/dL (ref 0.0–40.0)

## 2022-09-03 LAB — TSH: TSH: 2 u[IU]/mL (ref 0.35–5.50)

## 2022-09-03 MED ORDER — CIPROFLOXACIN HCL 500 MG PO TABS: ORAL_TABLET | ORAL | 0 refills | Status: DC

## 2022-09-03 MED ORDER — SCOPOLAMINE 1 MG/3DAYS TD PT72: 1.0000 | MEDICATED_PATCH | TRANSDERMAL | 0 refills | Status: DC

## 2022-09-03 NOTE — Progress Notes (Signed)
Subjective:     Patient ID: Colleen Gardner, female    DOB: 1966-04-30, 56 y.o.   MRN: [id number]  Chief Complaint  Patient presents with   Annual Exam    HPI  Patient presents today for complete physical.  Immunizations: Declines covid vaccine, flu shot today Diet: healthy Wt Readings from Last 3 Encounters:  09/03/22 121 lb (54.9 kg)  08/29/22 117 lb (53.1 kg)  09/02/21 117 lb (53.1 kg)  Exercise: not enough, some walking Colonoscopy: 2021 Pap Smear: 7/20 Mammogram: scheduled Vision: up to date Dental: up to date.   She will be travelling to [place].  8 days will be on a cruise. Wondering about any medications she may need for her trip.   Health Maintenance Due  Topic Date Due   COVID-19 Vaccine (3 - Moderna series) 05/17/2020   PAP SMEAR-Modifier  07/10/2022    Past Medical History:  Diagnosis Date   Arthritis    self report   Factor V Leiden mutation ([place])    Heart murmur    MVP as child resolved.    Past Surgical History:  Procedure Laterality Date   ANTERIOR AND POSTERIOR REPAIR N/A 05/22/2015   Procedure: ANTERIOR (CYSTOCELE) AND POSTERIOR REPAIR (RECTOCELE);  Surgeon: Anastasio Auerbach, MD;  Location: [facility];  Service: Gynecology;  Laterality: N/A;   BILATERAL SALPINGECTOMY Bilateral 05/22/2015   Procedure: BILATERAL SALPINGECTOMY;  Surgeon: Anastasio Auerbach, MD;  Location: [facility];  Service: Gynecology;  Laterality: Bilateral;   HYSTEROSCOPY  04/2005   HYSTEROSCOPIC MYOMECTOMY   IUD REMOVAL  08/2009   side effect symptoms after 2 months placement   TONSILLECTOMY AND SINUS SURGERY  2006   VAGINAL HYSTERECTOMY N/A 05/22/2015   Procedure: HYSTERECTOMY VAGINAL;  Surgeon: Anastasio Auerbach, MD;  Location: [facility];  Service: Gynecology;  Laterality: N/A; FOR UTERINE PROLAPSE    Family History  Problem Relation Age of Onset   Hypertension Mother    Clotting disorder Father    Transient ischemic attack Father    Dementia Father    Clotting disorder Sister     Cirrhosis Brother    Lung cancer Paternal Grandfather    Colon polyps Neg Hx    Colon cancer Neg Hx    Esophageal cancer Neg Hx    Stomach cancer Neg Hx    Rectal cancer Neg Hx     Social History   Socioeconomic History   Marital status: Married    Spouse name: Not on file   Number of children: Not on file   Years of education: Not on file   Highest education level: Not on file  Occupational History   Not on file  Tobacco Use   Smoking status: Never   Smokeless tobacco: Never  Vaping Use   Vaping Use: Never used  Substance and Sexual Activity   Alcohol use: Yes    Alcohol/week: 1.0 standard drink of alcohol    Types: 1 Glasses of wine per week    Comment: 3 times a week   Drug use: No   Sexual activity: Yes    Partners: Male    Birth control/protection: Other-see comments, None    Comment: Vasectomy-1st intercourse 56 yo--Fewer than 5 partners  Other Topics Concern   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 Swallow whole. 30 tablet 11   Calcium Carb-Cholecalciferol (CALCIUM 1000 + D PO) Take by mouth.     estradiol (VIVELLE-DOT) 0.0375 MG/24HR Place onto the skin.     Probiotic Product (PROBIOTIC PO) Take by mouth.     estradiol (VIVELLE-DOT) 0.025 MG/24HR Place 1 patch onto the skin 2 (two) times a week. 8 patch 2   No facility-administered medications prior to visit.    Allergies   Allergen Reactions   Clindamycin/Lincomycin Diarrhea   Lactose Intolerance (Gi)     Review of Systems  Cardiovascular:  Positive for palpitations.  Genitourinary:        Reports occasional right sided pelvic pain ("sharp pain")       Objective:    Physical Exam Exam conducted with a chaperone present.  Constitutional:      General: She is not in acute distress.    Appearance: Normal appearance. She is well-developed.  HENT:     Head: Normocephalic and atraumatic.     Right Ear: External ear normal.     Left Ear: External ear normal.  Eyes:     General: No scleral icterus. Neck:     Thyroid: No thyromegaly.  Cardiovascular:     Rate and Rhythm: Normal rate and regular rhythm.     Heart sounds: Normal heart sounds. No murmur heard. Pulmonary:     Effort: Pulmonary effort is normal. No respiratory distress.     Breath sounds: Normal breath sounds. No wheezing.  Genitourinary:    General: Normal vulva.     Comments: Uterus surgically absent.  Firm ropey tissue noted in right adnexa and midline Musculoskeletal:        General: No swelling.     Cervical back: Neck supple.  Skin:    General: Skin is warm and dry.     Coloration: Skin is not jaundiced.  Neurological:     Mental Status: She is alert and oriented to person, place, and time.  Psychiatric:        Mood and Affect: Mood normal.        Behavior: Behavior normal.        Thought Content: Thought content normal.        Judgment: Judgment normal.     BP 120/79 (BP Location: Right Arm, Patient Position: Sitting, Cuff Size: Small)   Pulse 66   Temp 98.2 F (36.8 C) (Oral)   Resp 16   Ht 5' 5.5" (1.664 m)   Wt 121 lb (54.9 kg)   LMP 04/20/2015   SpO2 100%   BMI 19.83 kg/m  Wt Readings from Last 3 Encounters:  09/03/22 121 lb (54.9 kg)  08/29/22 117 lb (53.1 kg)  09/02/21 117 lb (53.1 kg)       Assessment & Plan:   Problem List Items Addressed This Visit       Unprioritized   Travel advice  encounter    She does have hx of motion sickness. I sent an rx for a scopolamine patch.  Also sent rx for cipro prn traveller's diarrhea.       Preventative health care - Primary    Discussed healthy diet, regular exercise.  Mammo/colo up to date. Flu shot today. Declines covid vaccine.       Palpitations    New.  Will refer for cardiology evaluation. EKG tracing is personally reviewed.  EKG notes NSR/short PR interval.  No acute changes.       Relevant Orders   EKG 12-Lead (Completed)   Ambulatory referral to Cardiology   Factor V Leiden mutation ([place])    Continue aspirin '81mg'$  for DVT prophylaxis.       Abnormal pelvic exam    ? If tissue I am feeling is scar tissue from her hysterectomy. Will obtain pelvic US for further evaluation.       Other Visit Diagnoses     Needs flu shot       Relevant Orders   Flu Vaccine QUAD 6+ mos PF IM (Fluarix Quad PF) (Completed)   Encounter for routine gynecological examination with Papanicolaou smear of cervix       Relevant Orders   Cytology - PAP( [facility])   Adnexal mass       Relevant Orders   US Pelvic Complete With Transvaginal       I am having Paralee V. Dehaven start on scopolamine and ciprofloxacin. I am also having her maintain her Probiotic Product (PROBIOTIC PO), Calcium Carb-Cholecalciferol (CALCIUM 1000 + D PO), aspirin EC, and estradiol.  Meds ordered this encounter  Medications   scopolamine (TRANSDERM-SCOP) 1 MG/3DAYS    Sig: Place 1 patch (1.5 mg total) onto the skin every 3 (three) days.    Dispense:  5 patch    Refill:  0    Order Specific Question:   Supervising Provider    Answer:   Penni Homans A [[id number]]   ciprofloxacin (CIPRO) 500 MG tablet    Sig: Take 1 tablet twice daily for 3 days as needed for traveller's diarrhea.    Dispense:  6 tablet    Refill:  0    Order Specific Question:   Supervising Provider    Answer:   Penni Homans A [[id number]]

## 2022-09-03 NOTE — Assessment & Plan Note (Signed)
She does have hx of motion sickness. I sent an rx for a scopolamine patch.  Also sent rx for cipro prn traveller's diarrhea.

## 2022-09-03 NOTE — Assessment & Plan Note (Addendum)
New.  Will refer for cardiology evaluation. EKG tracing is personally reviewed.  EKG notes NSR/short PR interval.  No acute changes.

## 2022-09-03 NOTE — Assessment & Plan Note (Signed)
Continue aspirin '81mg'$  for DVT prophylaxis.

## 2022-09-03 NOTE — Assessment & Plan Note (Signed)
Discussed healthy diet, regular exercise.  Mammo/colo up to date. Flu shot today. Declines covid vaccine.

## 2022-09-03 NOTE — Addendum Note (Signed)
Addended by: Debbrah Alar on: 09/03/2022 04:43 PM   Modules accepted: Orders

## 2022-09-03 NOTE — Assessment & Plan Note (Signed)
?   If tissue I am feeling is scar tissue from her hysterectomy. Will obtain pelvic US for further evaluation.

## 2022-09-04 ENCOUNTER — Ambulatory Visit (HOSPITAL_BASED_OUTPATIENT_CLINIC_OR_DEPARTMENT_OTHER)
Admission: RE | Admit: 2022-09-04 | Discharge: 2022-09-04 | Disposition: A | Discharge status: Home / Self Care | Payer: No Typology Code available for payment source | Source: Ambulatory Visit | Attending: Family | Admitting: Family

## 2022-09-04 DIAGNOSIS — N9489 Other specified conditions associated with female genital organs and menstrual cycle: Secondary | ICD-10-CM | POA: Diagnosis present

## 2022-10-06 ENCOUNTER — Encounter (HOSPITAL_BASED_OUTPATIENT_CLINIC_OR_DEPARTMENT_OTHER): Payer: Self-pay

## 2022-10-06 ENCOUNTER — Ambulatory Visit (HOSPITAL_BASED_OUTPATIENT_CLINIC_OR_DEPARTMENT_OTHER)
Admission: RE | Admit: 2022-10-06 | Discharge: 2022-10-06 | Disposition: A | Discharge status: Home / Self Care | Payer: No Typology Code available for payment source | Source: Ambulatory Visit | Attending: Family | Admitting: Family

## 2022-10-06 DIAGNOSIS — Z1231 Encounter for screening mammogram for malignant neoplasm of breast: Secondary | ICD-10-CM | POA: Diagnosis present

## 2022-11-03 ENCOUNTER — Ambulatory Visit: Payer: No Typology Code available for payment source | Admitting: Cardiology

## 2022-11-15 ENCOUNTER — Other Ambulatory Visit: Payer: Self-pay | Admitting: Family

## 2023-03-30 ENCOUNTER — Encounter: Payer: Self-pay | Admitting: *Deleted

## 2023-07-30 ENCOUNTER — Encounter (INDEPENDENT_AMBULATORY_CARE_PROVIDER_SITE_OTHER): Payer: Self-pay

## 2023-09-07 ENCOUNTER — Ambulatory Visit: Payer: No Typology Code available for payment source | Admitting: Family

## 2023-09-07 ENCOUNTER — Encounter: Payer: Self-pay | Admitting: Family

## 2023-09-07 VITALS — BP 121/78 | HR 70 | Temp 97.9°F | Resp 16 | Ht 65.2 in | Wt 126.0 lb

## 2023-09-07 DIAGNOSIS — Z0000 Encounter for general adult medical examination without abnormal findings: Secondary | ICD-10-CM | POA: Diagnosis not present

## 2023-09-07 DIAGNOSIS — Z23 Encounter for immunization: Secondary | ICD-10-CM

## 2023-09-07 DIAGNOSIS — N951 Menopausal and female climacteric states: Secondary | ICD-10-CM | POA: Diagnosis not present

## 2023-09-07 DIAGNOSIS — Z1231 Encounter for screening mammogram for malignant neoplasm of breast: Secondary | ICD-10-CM | POA: Diagnosis not present

## 2023-09-07 LAB — COMPREHENSIVE METABOLIC PANEL
ALT: 12 U/L (ref 0–35)
AST: 15 U/L (ref 0–37)
Albumin: 4 g/dL (ref 3.5–5.2)
Alkaline Phosphatase: 45 U/L (ref 39–117)
BUN: 14 mg/dL (ref 6–23)
CO2: 28 mEq/L (ref 19–32)
Calcium: 9.1 mg/dL (ref 8.4–10.5)
Chloride: 105 mEq/L (ref 96–112)
Creatinine, Ser: 0.82 mg/dL (ref 0.40–1.20)
GFR: 80.81 mL/min (ref >60.00)
Glucose, Bld: 87 mg/dL (ref 70–99)
Potassium: 4.5 mEq/L (ref 3.5–5.1)
Sodium: 140 mEq/L (ref 135–145)
Total Bilirubin: 0.5 mg/dL (ref 0.2–1.2)
Total Protein: 6.4 g/dL (ref 6.0–8.3)

## 2023-09-07 LAB — CBC WITH DIFFERENTIAL/PLATELET
Basophils Absolute: 0 10*3/uL (ref 0.0–0.1)
Basophils Relative: 1.2 % (ref 0.0–3.0)
Eosinophils Absolute: 0.1 10*3/uL (ref 0.0–0.7)
Eosinophils Relative: 1.4 % (ref 0.0–5.0)
HCT: 43.5 % (ref 36.0–46.0)
Hemoglobin: 14.4 g/dL (ref 12.0–15.0)
Lymphocytes Relative: 25.3 % (ref 12.0–46.0)
Lymphs Abs: 1 10*3/uL (ref 0.7–4.0)
MCHC: 33.2 g/dL (ref 30.0–36.0)
MCV: 99 fl (ref 78.0–100.0)
Monocytes Absolute: 0.3 10*3/uL (ref 0.1–1.0)
Monocytes Relative: 9 % (ref 3.0–12.0)
Neutro Abs: 2.4 10*3/uL (ref 1.4–7.7)
Neutrophils Relative %: 63.1 % (ref 43.0–77.0)
Platelets: 184 10*3/uL (ref 150.0–400.0)
RBC: 4.39 Mil/uL (ref 3.87–5.11)
RDW: 12.8 % (ref 11.5–15.5)
WBC: 3.8 10*3/uL — ABNORMAL LOW (ref 4.0–10.5)

## 2023-09-07 LAB — TSH: TSH: 2.35 u[IU]/mL (ref 0.35–5.50)

## 2023-09-07 LAB — LIPID PANEL
Cholesterol: 178 mg/dL (ref 0–200)
HDL: 84 mg/dL (ref >39.00)
LDL Cholesterol: 78 mg/dL (ref 0–99)
NonHDL: 93.78
Total CHOL/HDL Ratio: 2
Triglycerides: 78 mg/dL (ref 0.0–149.0)
VLDL: 15.6 mg/dL (ref 0.0–40.0)

## 2023-09-07 MED ORDER — ESTRADIOL 0.0375 MG/24HR TD PTTW: 1.0000 | MEDICATED_PATCH | TRANSDERMAL | 4 refills | Status: DC

## 2023-09-07 NOTE — Progress Notes (Signed)
Subjective:     Patient ID: Colleen Gardner, female    DOB: September 09, 1966, 57 y.o.   MRN: [id number]  Chief Complaint  Patient presents with   Annual Exam    HPI  Discussed the use of AI scribe software for clinical note transcription with the patient, who gave verbal consent to proceed.  History of Present Illness   The patient, with a history of hip pain, presents for a routine physical. She reports worsening hip pain, particularly with squatting and sitting on the floor. She denies any other health concerns. She has been managing the pain with over-the-counter Tylenol and has not sought further medical intervention. She acknowledges the need for regular exercise to help with joint health but struggles with motivation. She typically walks for exercise when she can motivate herself to do so. She also reports a decrease in near vision and uses reading glasses. She has been using an estrogen patch and a probiotic, which she reports has been helpful in managing digestive issues related to the estrogen patch. She consumes a small glass of wine most evenings.    Patient presents today for complete physical.  Immunizations: She thinks she had a tetanus recently Diet:healthy Exercise: not regular Colonoscopy: up to date until 2031 Pap Smear: hysterectomy Mammogram: 10/06/22       Health Maintenance Due  Topic Date Due   INFLUENZA VACCINE  07/16/2023   COVID-19 Vaccine (3 - 2023-24 season) 08/16/2023    Past Medical History:  Diagnosis Date   Arthritis    self report   Factor V Leiden mutation ([facility])    Heart murmur    MVP as child resolved.    Past Surgical History:  Procedure Laterality Date   ANTERIOR AND POSTERIOR REPAIR N/A 05/22/2015   Procedure: ANTERIOR (CYSTOCELE) AND POSTERIOR REPAIR (RECTOCELE);  Surgeon: Dara Lords, MD;  Location: [facility];  Service: Gynecology;  Laterality: N/A;   BILATERAL SALPINGECTOMY Bilateral 05/22/2015   Procedure: BILATERAL SALPINGECTOMY;   Surgeon: Dara Lords, MD;  Location: [facility];  Service: Gynecology;  Laterality: Bilateral;   HYSTEROSCOPY  04/2005   HYSTEROSCOPIC MYOMECTOMY   IUD REMOVAL  08/2009   side effect symptoms after 2 months placement   TONSILLECTOMY AND SINUS SURGERY  2006   VAGINAL HYSTERECTOMY N/A 05/22/2015   Procedure: HYSTERECTOMY VAGINAL;  Surgeon: Dara Lords, MD;  Location: [facility];  Service: Gynecology;  Laterality: N/A; FOR UTERINE PROLAPSE    Family History  Problem Relation Age of Onset   Hypertension Mother    Clotting disorder Father    Transient ischemic attack Father    Dementia Father    Clotting disorder Sister    Cirrhosis Brother    Lung cancer Paternal Grandfather    Colon polyps Neg Hx    Colon cancer Neg Hx    Esophageal cancer Neg Hx    Stomach cancer Neg Hx    Rectal cancer Neg Hx     Social History   Socioeconomic History   Marital status: Married    Spouse name: Not on file   Number of children: Not on file   Years of education: Not on file   Highest education level: Not on file  Occupational History   Not on file  Tobacco Use   Smoking status: Never   Smokeless tobacco: Never  Vaping Use   Vaping status: Never Used  Substance and Sexual Activity   Alcohol use: Yes    Alcohol/week: 7.0 standard drinks of  alcohol    Types: 7 Glasses of wine per week   Drug use: No   Sexual activity: Yes    Partners: Male    Birth control/protection: Other-see comments, None    Comment: Vasectomy-1st intercourse 57 yo--Fewer than 5 partners  Other Topics Concern   Not on file  Social History Narrative   2 grown sons (1 local, one in the [employer])   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 Swallow whole. 30 tablet 11   Calcium Carb-Cholecalciferol (CALCIUM 1000 + D PO) Take by mouth.     Probiotic Product (PROBIOTIC PO) Take by mouth.     estradiol (VIVELLE-DOT) 0.0375 MG/24HR PLACE 1 PATCH ONTO THE SKIN2 TIMES A WEEK. 24 patch 4   ciprofloxacin (CIPRO) 500 MG tablet Take 1 tablet twice daily for 3 days as needed for traveller's diarrhea. 6 tablet 0   scopolamine (TRANSDERM-SCOP) 1 MG/3DAYS Place 1 patch (1.5 mg total) onto the skin every 3 (three) days. 5 patch 0   No facility-administered medications prior to visit.    Allergies  Allergen Reactions   Clindamycin/Lincomycin Diarrhea   Lactose Intolerance (Gi)     Review of Systems  Constitutional:  Negative for weight loss.  HENT:  Negative for congestion and hearing loss.   Eyes:  Negative for blurred vision.  Respiratory:  Negative for cough.   Cardiovascular:  Negative for leg swelling.  Gastrointestinal:  Negative for constipation and diarrhea.  Genitourinary:  Negative for dysuria and frequency.  Musculoskeletal:  Positive for joint pain.  Skin:  Negative for rash.  Neurological:  Negative for headaches.  Psychiatric/Behavioral:         Denies depression/anxiety       Objective:    Physical Exam   BP 121/78 (BP Location: Right Arm, Patient Position: Sitting, Cuff Size: Small)   Pulse 70   Temp 97.9 F (36.6 C) (Oral)   Resp 16   Ht 5' 5.2" (1.656 m)   Wt 126 lb (57.2 kg)   LMP 04/20/2015   SpO2 100%   BMI 20.84 kg/m  Wt Readings from Last 3 Encounters:  09/07/23 126 lb (57.2 kg)  09/03/22 121 lb (54.9 kg)  08/29/22 117 lb (53.1 kg)   Physical Exam  Constitutional: She is oriented to person, place, and time. She appears well-developed and well-nourished. No distress.   HENT:  Head: Normocephalic and atraumatic.  Right Ear: Tympanic membrane and ear canal normal.  Left Ear: Tympanic membrane and ear canal normal.  Mouth/Throat: Oropharynx is clear and moist.  Eyes: Pupils are equal, round, and reactive to light. No scleral icterus.  Neck: Normal range of motion. No thyromegaly present.  Cardiovascular: Normal rate and regular rhythm.   No murmur heard. Pulmonary/Chest: Effort normal and breath sounds normal. No respiratory distress. He has no wheezes. She has no rales. She exhibits no tenderness.  Abdominal: Soft. Bowel sounds are normal. She exhibits  no distension and no mass. There is no tenderness. There is no rebound and no guarding.  Musculoskeletal: She exhibits no edema.  Lymphadenopathy:    She has no cervical adenopathy.  Neurological: She is alert and oriented to person, place, and time. She has decreased patellar reflexes. She exhibits normal muscle tone. Coordination normal.  Skin: Skin is warm and dry.  Psychiatric: She has a normal mood and affect. Her behavior is normal. Judgment and thought content normal.  Breast/pelvic: deferred        Assessment & Plan:       Assessment & Plan:   Problem List Items Addressed This Visit       Unprioritized   Preventative health care    Encouraged pt to continue healthy diet and get covid booster at her pharmacy. Flu shot today in office.  Discontinue paps as pt is s/p hysterectomy.  Update mammo at the end of October.  Encouraged her to add 30 minutes of exercise 5 days a week.       Relevant Orders   Comp Met (CMET)   CBC w/Diff   TSH   Lipid panel   Post menopausal syndrome    States that she tried to discontinue her estrogen patch, but had severe hot flashes so she restarted. We discussed stepping the dose down to 0.025 but she declines at this time.      Other Visit Diagnoses     Breast cancer screening by mammogram    -  Primary   Relevant Orders   MM 3D SCREENING MAMMOGRAM  BILATERAL BREAST       I have discontinued Brendalyn V. Vanpelt's scopolamine and ciprofloxacin. I have also changed her estradiol. Additionally, I am having her maintain her Probiotic Product (PROBIOTIC PO), Calcium Carb-Cholecalciferol (CALCIUM 1000 + D PO), and aspirin EC.  Meds ordered this encounter  Medications   estradiol (VIVELLE-DOT) 0.0375 MG/24HR    Sig: Place 1 patch onto the skin 2 (two) times a week.    Dispense:  24 patch    Refill:  4    Order Specific Question:   Supervising Provider    Answer:   Danise Edge A [[id number]]

## 2023-09-07 NOTE — Addendum Note (Signed)
Addended by: Wilford Corner on: 09/07/2023 09:23 AM   Modules accepted: Orders

## 2023-09-07 NOTE — Patient Instructions (Signed)
VISIT SUMMARY:  During your recent visit, we discussed your ongoing hip pain and menopausal symptoms. We also reviewed your general health maintenance plan. You reported that your hip pain has worsened, particularly when squatting and sitting on the floor. You have been managing this pain with over-the-counter Tylenol. We also discussed your menopausal symptoms, which you have been managing with an estrogen patch and a probiotic.  YOUR PLAN:  -HIP PAIN: Your hip pain is chronic and has been worsening. We discussed several options for managing this pain, including imaging, referral to orthopedics, joint injections, and hip replacement. However, you decided not to pursue these options at this time. Instead, you will continue to use over-the-counter Tylenol as needed for pain. I also encouraged you to exercise regularly, specifically walking for 30 minutes five days a week, which can help with joint health.  -MENOPAUSAL SYMPTOMS: You are currently using an estrogen patch twice a week to manage your menopausal symptoms. We discussed the possibility of reducing the dose, but you decided to continue with the current regimen. You also reported that a probiotic has been helpful in managing digestive issues related to the estrogen patch.  -GENERAL HEALTH MAINTENANCE: We reviewed your general health maintenance plan. I have ordered a mammogram for you, which should be scheduled after October 23rd. I also administered the influenza vaccine during your visit and encouraged you to get a COVID-19 booster shot at a major pharmacy. I have ordered a routine lab panel for you. Your next tetanus shot is due by your next physical in February 2025. You should confirm your recent titer from work. As you have had a hysterectomy, you no longer need Pap smears or an annual pelvic exam unless you request one. Your next physical is in 1 year unless issues arise.  INSTRUCTIONS:  Please schedule your mammogram after October 23rd.  Also, get a COVID-19 booster shot at a major pharmacy. Confirm your recent titer from work. Remember to exercise regularly, specifically walking for 30 minutes five days a week. Continue to use over-the-counter Tylenol as needed for hip pain and your current regimen of estrogen patch twice a week for menopausal symptoms. Your next physical is in 1 year unless issues arise.

## 2023-09-07 NOTE — Assessment & Plan Note (Signed)
States that she tried to discontinue her estrogen patch, but had severe hot flashes so she restarted. We discussed stepping the dose down to 0.025 but she declines at this time.

## 2023-09-07 NOTE — Assessment & Plan Note (Signed)
Encouraged pt to continue healthy diet and get covid booster at her pharmacy. Flu shot today in office.  Discontinue paps as pt is s/p hysterectomy.  Update mammo at the end of October.  Encouraged her to add 30 minutes of exercise 5 days a week.

## 2023-11-03 ENCOUNTER — Encounter (HOSPITAL_BASED_OUTPATIENT_CLINIC_OR_DEPARTMENT_OTHER): Payer: Self-pay

## 2023-11-03 ENCOUNTER — Ambulatory Visit (HOSPITAL_BASED_OUTPATIENT_CLINIC_OR_DEPARTMENT_OTHER)
Admission: RE | Admit: 2023-11-03 | Discharge: 2023-11-03 | Disposition: A | Discharge status: Home / Self Care | Payer: No Typology Code available for payment source | Source: Ambulatory Visit | Attending: Family | Admitting: Family

## 2023-11-03 DIAGNOSIS — Z1231 Encounter for screening mammogram for malignant neoplasm of breast: Secondary | ICD-10-CM | POA: Diagnosis present

## 2024-02-16 ENCOUNTER — Other Ambulatory Visit: Payer: Self-pay | Admitting: Family

## 2024-06-08 ENCOUNTER — Telehealth: Admitting: Physician Assistant

## 2024-06-08 DIAGNOSIS — B9689 Other specified bacterial agents as the cause of diseases classified elsewhere: Secondary | ICD-10-CM

## 2024-06-08 DIAGNOSIS — J019 Acute sinusitis, unspecified: Secondary | ICD-10-CM | POA: Diagnosis not present

## 2024-06-08 MED ORDER — AMOXICILLIN-POT CLAVULANATE 875-125 MG PO TABS: 1.0000 | ORAL_TABLET | Freq: Two times a day (BID) | ORAL | 0 refills | Status: DC

## 2024-06-08 NOTE — Progress Notes (Signed)
 I have spent 5 minutes in review of e-visit questionnaire, review and updating patient chart, medical decision making and response to patient.   Piedad Climes, PA-C

## 2024-06-08 NOTE — Progress Notes (Signed)

## 2024-06-09 MED ORDER — FLUCONAZOLE 150 MG PO TABS: ORAL_TABLET | ORAL | 0 refills | Status: DC

## 2024-06-09 NOTE — Addendum Note (Signed)
 Addended by: GLADIS ELSIE BROCKS on: 06/09/2024 06:58 AM   Modules accepted: Orders

## 2024-08-05 ENCOUNTER — Encounter: Payer: Self-pay | Admitting: Family

## 2024-08-08 ENCOUNTER — Other Ambulatory Visit: Payer: Self-pay

## 2024-08-08 MED ORDER — ESTRADIOL 0.0375 MG/24HR TD PTTW: 1.0000 | MEDICATED_PATCH | TRANSDERMAL | 1 refills | Status: AC

## 2024-09-09 ENCOUNTER — Ambulatory Visit: Payer: Self-pay | Admitting: Family

## 2024-09-09 ENCOUNTER — Ambulatory Visit: Payer: No Typology Code available for payment source | Admitting: Family

## 2024-09-09 ENCOUNTER — Encounter: Payer: Self-pay | Admitting: Family

## 2024-09-09 VITALS — BP 122/68 | HR 66 | Temp 97.8°F | Resp 16 | Ht 65.0 in | Wt 126.0 lb

## 2024-09-09 DIAGNOSIS — Z0000 Encounter for general adult medical examination without abnormal findings: Secondary | ICD-10-CM

## 2024-09-09 DIAGNOSIS — Z23 Encounter for immunization: Secondary | ICD-10-CM | POA: Diagnosis not present

## 2024-09-09 DIAGNOSIS — N951 Menopausal and female climacteric states: Secondary | ICD-10-CM

## 2024-09-09 DIAGNOSIS — D6851 Activated protein C resistance: Secondary | ICD-10-CM

## 2024-09-09 DIAGNOSIS — Z1231 Encounter for screening mammogram for malignant neoplasm of breast: Secondary | ICD-10-CM

## 2024-09-09 LAB — COMPREHENSIVE METABOLIC PANEL WITH GFR
ALT: 11 U/L (ref 0–35)
AST: 15 U/L (ref 0–37)
Albumin: 4.4 g/dL (ref 3.5–5.2)
Alkaline Phosphatase: 46 U/L (ref 39–117)
BUN: 14 mg/dL (ref 6–23)
CO2: 32 meq/L (ref 19–32)
Calcium: 9.6 mg/dL (ref 8.4–10.5)
Chloride: 103 meq/L (ref 96–112)
Creatinine, Ser: 0.88 mg/dL (ref 0.40–1.20)
GFR: 73.72 mL/min (ref >60.00)
Glucose, Bld: 94 mg/dL (ref 70–99)
Potassium: 4.8 meq/L (ref 3.5–5.1)
Sodium: 139 meq/L (ref 135–145)
Total Bilirubin: 0.6 mg/dL (ref 0.2–1.2)
Total Protein: 6.9 g/dL (ref 6.0–8.3)

## 2024-09-09 LAB — LIPID PANEL
Cholesterol: 188 mg/dL (ref 0–200)
HDL: 80.5 mg/dL (ref >39.00)
LDL Cholesterol: 89 mg/dL (ref 0–99)
NonHDL: 107.34
Total CHOL/HDL Ratio: 2
Triglycerides: 94 mg/dL (ref 0.0–149.0)
VLDL: 18.8 mg/dL (ref 0.0–40.0)

## 2024-09-09 LAB — CBC WITH DIFFERENTIAL/PLATELET
Basophils Absolute: 0 K/uL (ref 0.0–0.1)
Basophils Relative: 0.8 % (ref 0.0–3.0)
Eosinophils Absolute: 0 K/uL (ref 0.0–0.7)
Eosinophils Relative: 1.1 % (ref 0.0–5.0)
HCT: 44.7 % (ref 36.0–46.0)
Hemoglobin: 15.1 g/dL — ABNORMAL HIGH (ref 12.0–15.0)
Lymphocytes Relative: 29.9 % (ref 12.0–46.0)
Lymphs Abs: 1 K/uL (ref 0.7–4.0)
MCHC: 33.7 g/dL (ref 30.0–36.0)
MCV: 97 fl (ref 78.0–100.0)
Monocytes Absolute: 0.3 K/uL (ref 0.1–1.0)
Monocytes Relative: 10.1 % (ref 3.0–12.0)
Neutro Abs: 1.9 K/uL (ref 1.4–7.7)
Neutrophils Relative %: 58.1 % (ref 43.0–77.0)
Platelets: 170 K/uL (ref 150.0–400.0)
RBC: 4.61 Mil/uL (ref 3.87–5.11)
RDW: 12.7 % (ref 11.5–15.5)
WBC: 3.2 K/uL — ABNORMAL LOW (ref 4.0–10.5)

## 2024-09-09 LAB — TSH: TSH: 2.01 u[IU]/mL (ref 0.35–5.50)

## 2024-09-09 NOTE — Assessment & Plan Note (Signed)
 Per hematology OK to remain on Estrogen patch as long as she does not have thrombus.

## 2024-09-09 NOTE — Progress Notes (Signed)
 Subjective:     Patient ID: Colleen Gardner, female    DOB: 1966-02-01, 58 y.o.   MRN: [id number]  Chief Complaint  Patient presents with   Annual Exam    HPI  Discussed the use of AI scribe software for clinical note transcription with the patient, who gave verbal consent to proceed.  History of Present Illness  Colleen Gardner is a 58 year old female who presents for an annual physical exam.  She requires a flu shot for work and prefers to receive it during this visit. She declines the hepatitis B and pneumonia vaccines.  She struggles with maintaining a regular exercise routine, citing laziness as the primary barrier. She prefers walking as a feasible form of exercise and is attempting to motivate herself to engage in it more regularly.  Her colonoscopy was completed in 2021. She has had a hysterectomy. Her last mammogram was in November 2024.  She is uncertain about the recency of her vision exam, estimating it was a couple of years ago, but confirms her dental check-ups are current.  Immunizations: flu shot today Diet: fair Exercise: not regular Colonoscopy:  2021 due 2031 Pap Smear: hysterectomy Mammogram: 11/24 Vision: due Dental:  up to date      Health Maintenance Due  Topic Date Due   DTaP/Tdap/Td (2 - Td or Tdap) 01/28/2024   Influenza Vaccine  07/15/2024   COVID-19 Vaccine (3 - 2025-26 season) 08/15/2024    Past Medical History:  Diagnosis Date   Arthritis    self report   Factor V Leiden mutation    Heart murmur    MVP as child resolved.    Past Surgical History:  Procedure Laterality Date   ANTERIOR AND POSTERIOR REPAIR N/A 05/22/2015   Procedure: ANTERIOR (CYSTOCELE) AND POSTERIOR REPAIR (RECTOCELE);  Surgeon: Colleen Gardner;  Location: [facility];  Service: Gynecology;  Laterality: N/A;   BILATERAL SALPINGECTOMY Bilateral 05/22/2015   Procedure: BILATERAL SALPINGECTOMY;  Surgeon: Colleen Gardner;  Location: [facility];  Service:  Gynecology;  Laterality: Bilateral;   HYSTEROSCOPY  04/2005   HYSTEROSCOPIC MYOMECTOMY   IUD REMOVAL  08/2009   side effect symptoms after 2 months placement   TONSILLECTOMY AND SINUS SURGERY  2006   VAGINAL HYSTERECTOMY N/A 05/22/2015   Procedure: HYSTERECTOMY VAGINAL;  Surgeon: Colleen Gardner;  Location: [facility];  Service: Gynecology;  Laterality: N/A; FOR UTERINE PROLAPSE    Family History  Problem Relation Age of Onset   Hypertension Mother    Clotting disorder Father    Transient ischemic attack Father    Dementia Father        alzheimers   Clotting disorder Sister    Cirrhosis Brother    Lung cancer Paternal Grandfather    Colon polyps Neg Hx    Colon cancer Neg Hx    Esophageal cancer Neg Hx    Stomach cancer Neg Hx    Rectal cancer Neg Hx     Social History   Socioeconomic History   Marital status: Married    Spouse name: Not on file   Number of children: Not on file   Years of education: Not on file   Highest education level: Not on file  Occupational History   Not on file  Tobacco Use   Smoking status: Never   Smokeless tobacco: Never  Vaping Use   Vaping status: Never Used  Substance and Sexual Activity   Alcohol use: Yes  Alcohol/week: 5.0 standard drinks of alcohol    Types: 5 Glasses of wine per week   Drug use: No   Sexual activity: Yes    Partners: Male    Birth control/protection: Other-see comments, None    Comment: Vasectomy-1st intercourse 58 yo--Fewer than 5 partners  Other Topics Concern   Not on file  Social History Narrative   2 grown sons (1 local, one in the [employer])   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 Swallow whole. 30 tablet 11   Calcium Carb-Cholecalciferol (CALCIUM 1000 + D PO) Take by mouth.     estradiol  (VIVELLE -DOT) 0.0375 MG/24HR Place 1 patch onto the skin 2 (two) times a week. 24 patch 1   Probiotic Product (PROBIOTIC PO) Take by mouth.     amoxicillin -clavulanate (AUGMENTIN ) 875-125 MG tablet Take 1 tablet by mouth 2 (two) times daily. 14 tablet 0   fluconazole  (DIFLUCAN ) 150 MG tablet Take 1 tablet PO once. Repeat in 3 days if needed. 2 tablet 0   No facility-administered medications prior to visit.    Allergies  Allergen Reactions   Clindamycin /Lincomycin Diarrhea   Lactose Intolerance (Gi)     Review of Systems  Constitutional:  Negative for weight loss.  HENT:  Negative for congestion and hearing loss.   Eyes:  Negative for blurred vision.  Respiratory:  Negative for cough.   Cardiovascular:  Negative for leg swelling.  Gastrointestinal:  Negative for constipation and diarrhea.  Genitourinary:  Negative for dysuria and frequency.  Musculoskeletal:  Negative for joint pain and myalgias.  Skin:  Negative for rash.  Neurological:  Negative for headaches.  Psychiatric/Behavioral:  Negative for depression. The patient is not nervous/anxious.        Objective:    Physical Exam Constitutional:      General: She is not in acute distress.    Appearance: Normal appearance. She is well-developed.  HENT:     Head: Normocephalic and atraumatic.     Right Ear: Tympanic membrane, ear canal and external ear normal.     Left Ear: Tympanic membrane, ear canal and external ear normal.  Eyes:     General: No scleral icterus.    Pupils: Pupils are equal, round, and reactive to light.  Neck:     Thyroid: No thyromegaly.  Cardiovascular:     Rate and Rhythm: Normal rate and regular rhythm.     Heart  sounds: Normal heart sounds. No murmur heard. Pulmonary:     Effort: Pulmonary effort is normal. No respiratory distress.     Breath sounds: Normal breath sounds. No wheezing.  Musculoskeletal:     Cervical back: Neck supple.  Lymphadenopathy:     Cervical: No cervical adenopathy.  Skin:    General: Skin is warm and dry.  Neurological:     General: No focal deficit present.     Mental Status: She is alert and oriented to person,  place, and time.     Cranial Nerves: No cranial nerve deficit.  Psychiatric:        Mood and Affect: Mood normal.        Behavior: Behavior normal.        Thought Content: Thought content normal.        Judgment: Judgment normal.   Breast/Pelvic: deferred   BP 122/68 (BP Location: Right Arm, Patient Position: Sitting, Cuff Size: Small)   Pulse 66   Temp 97.8 F (36.6 C) (Oral)   Resp 16   Ht 5' 5 (1.651 m)   Wt 126 lb (57.2 kg)   LMP 04/20/2015   SpO2 99%   BMI 20.97 kg/m  Wt Readings from Last 3 Encounters:  09/09/24 126 lb (57.2 kg)  09/07/23 126 lb (57.2 kg)  09/03/22 121 lb (54.9 kg)        Assessment & Plan:   Problem List Items Addressed This Visit       Unprioritized   Preventative health care    Routine wellness visit with no specific concerns. Immunizations reviewed; flu shot required for work. Hepatitis B and pneumonia vaccines discussed but declined. Exercise and diet discussed; she struggles with exercise due to lack of motivation. Colonoscopy up to date, mammogram due in November, vision exam not recent but not overdue, dental exam up to date. Family history updated with father's Alzheimer's diagnosis. - Administer flu shot. - Order mammogram for November. - Advise to schedule vision exam every few years.      Relevant Orders   Comp Met (CMET)   Lipid panel   CBC w/Diff   TSH   Post menopausal syndrome   Per hematology OK to remain on Estrogen patch as long as she does not have thrombus.       Factor V Leiden  mutation   Heterozygote, on aspirin  81mg  once daily for DVT prophylaxis.       Other Visit Diagnoses       Needs flu shot    -  Primary   Relevant Orders   Flu vaccine trivalent PF, 6mos and older(Flulaval,Afluria,Fluarix,Fluzone)     Breast cancer screening by mammogram       Relevant Orders   MM 3D SCREENING MAMMOGRAM BILATERAL BREAST       I have discontinued Hadlie V. Saintvil's amoxicillin -clavulanate and fluconazole . I am also having her maintain her Probiotic Product (PROBIOTIC PO), Calcium Carb-Cholecalciferol (CALCIUM 1000 + D PO), aspirin  EC, and estradiol .  No orders of the defined types were placed in this encounter.

## 2024-09-09 NOTE — Assessment & Plan Note (Signed)
  Routine wellness visit with no specific concerns. Immunizations reviewed; flu shot required for work. Hepatitis B and pneumonia vaccines discussed but declined. Exercise and diet discussed; she struggles with exercise due to lack of motivation. Colonoscopy up to date, mammogram due in November, vision exam not recent but not overdue, dental exam up to date. Family history updated with father's Alzheimer's diagnosis. - Administer flu shot. - Order mammogram for November. - Advise to schedule vision exam every few years.

## 2024-09-09 NOTE — Assessment & Plan Note (Signed)
 Heterozygote, on aspirin  81mg  once daily for DVT prophylaxis.

## 2024-09-09 NOTE — Patient Instructions (Signed)
 VISIT SUMMARY:  Today, you came in for your annual physical exam. We discussed your immunizations, exercise routine, and upcoming screenings.  YOUR PLAN:  FLU SHOT: You need a flu shot for work. -We administered the flu shot today.  EXERCISE ROUTINE: You mentioned struggling with maintaining a regular exercise routine. -Try to incorporate walking into your daily routine as a form of exercise.  MAMMOGRAM: Your last mammogram was in November 2024. -We have ordered your next mammogram for November.  VISION EXAM: You are unsure about the last time you had a vision exam. -Schedule a vision exam every few years to keep your eyes healthy.

## 2024-11-07 ENCOUNTER — Ambulatory Visit (HOSPITAL_BASED_OUTPATIENT_CLINIC_OR_DEPARTMENT_OTHER)
Admission: RE | Admit: 2024-11-07 | Discharge: 2024-11-07 | Disposition: A | Discharge status: Home / Self Care | Source: Ambulatory Visit | Attending: Family | Admitting: Family

## 2024-11-07 ENCOUNTER — Encounter (HOSPITAL_BASED_OUTPATIENT_CLINIC_OR_DEPARTMENT_OTHER): Payer: Self-pay

## 2024-11-07 DIAGNOSIS — Z1231 Encounter for screening mammogram for malignant neoplasm of breast: Secondary | ICD-10-CM | POA: Insufficient documentation

## 2025-09-13 ENCOUNTER — Encounter: Admitting: Family
